# Patient Record
Sex: Female | Born: 1940 | Race: White | Marital: Married
Health system: Midwestern US, Community
[De-identification: ages and names within clinical notes are randomized; demographics above are authoritative.]

## PROBLEM LIST (undated history)

## (undated) DIAGNOSIS — J449 Chronic obstructive pulmonary disease, unspecified: Secondary | ICD-10-CM

## (undated) DIAGNOSIS — G4730 Sleep apnea, unspecified: Secondary | ICD-10-CM

## (undated) DIAGNOSIS — K219 Gastro-esophageal reflux disease without esophagitis: Secondary | ICD-10-CM

## (undated) DIAGNOSIS — I10 Essential (primary) hypertension: Secondary | ICD-10-CM

## (undated) DIAGNOSIS — I498 Other specified cardiac arrhythmias: Secondary | ICD-10-CM

## (undated) DIAGNOSIS — K5730 Diverticulosis of large intestine without perforation or abscess without bleeding: Secondary | ICD-10-CM

## (undated) DIAGNOSIS — E039 Hypothyroidism, unspecified: Secondary | ICD-10-CM

## (undated) HISTORY — DX: Chronic obstructive pulmonary disease, unspecified: J44.9

## (undated) HISTORY — PX: VAGINAL HYSTERECTOMY: SUR661

## (undated) HISTORY — DX: Other specified cardiac arrhythmias: I49.8

## (undated) HISTORY — PX: STOMACH SURGERY: SHX791

## (undated) HISTORY — DX: Sleep apnea, unspecified: G47.30

## (undated) HISTORY — DX: Hypothyroidism, unspecified: E03.9

## (undated) HISTORY — DX: Essential (primary) hypertension: I10

## (undated) HISTORY — PX: OTHER SURGICAL HISTORY: SHX169

## (undated) HISTORY — DX: Gastro-esophageal reflux disease without esophagitis: K21.9

## (undated) HISTORY — DX: Diverticulosis of large intestine without perforation or abscess without bleeding: K57.30

---

## 1998-06-15 HISTORY — PX: PACEMAKER INSERTION: SHX728

## 1999-04-14 ENCOUNTER — Ambulatory Visit (HOSPITAL_COMMUNITY): Admission: RE | Admit: 1999-04-14 | Discharge: 1999-04-15 | Payer: Self-pay | Admitting: Cardiology

## 1999-04-15 ENCOUNTER — Encounter: Payer: Self-pay | Admitting: Cardiology

## 2000-10-12 ENCOUNTER — Encounter: Payer: Self-pay | Admitting: Emergency Medicine

## 2000-10-12 ENCOUNTER — Emergency Department (HOSPITAL_COMMUNITY): Admission: EM | Admit: 2000-10-12 | Discharge: 2000-10-12 | Payer: Self-pay | Admitting: Emergency Medicine

## 2002-06-06 ENCOUNTER — Other Ambulatory Visit: Admission: RE | Admit: 2002-06-06 | Discharge: 2002-06-06 | Payer: Self-pay | Admitting: Internal Medicine

## 2002-07-03 ENCOUNTER — Encounter: Admission: RE | Admit: 2002-07-03 | Discharge: 2002-07-03 | Payer: Self-pay | Admitting: Endocrinology

## 2002-07-03 ENCOUNTER — Encounter: Payer: Self-pay | Admitting: Endocrinology

## 2002-08-20 ENCOUNTER — Encounter: Payer: Self-pay | Admitting: *Deleted

## 2002-08-20 ENCOUNTER — Inpatient Hospital Stay (HOSPITAL_COMMUNITY): Admission: EM | Admit: 2002-08-20 | Discharge: 2002-08-22 | Payer: Self-pay | Admitting: *Deleted

## 2003-05-31 ENCOUNTER — Encounter (INDEPENDENT_AMBULATORY_CARE_PROVIDER_SITE_OTHER): Payer: Self-pay | Admitting: Specialist

## 2003-05-31 ENCOUNTER — Ambulatory Visit (HOSPITAL_COMMUNITY): Admission: RE | Admit: 2003-05-31 | Discharge: 2003-05-31 | Payer: Self-pay | Admitting: *Deleted

## 2003-07-06 ENCOUNTER — Inpatient Hospital Stay (HOSPITAL_COMMUNITY): Admission: EM | Admit: 2003-07-06 | Discharge: 2003-07-09 | Payer: Self-pay | Admitting: Emergency Medicine

## 2005-02-13 ENCOUNTER — Encounter: Admission: RE | Admit: 2005-02-13 | Discharge: 2005-02-13 | Payer: Self-pay | Admitting: Endocrinology

## 2005-05-05 ENCOUNTER — Encounter: Admission: RE | Admit: 2005-05-05 | Discharge: 2005-05-05 | Payer: Self-pay | Admitting: Endocrinology

## 2009-01-21 ENCOUNTER — Ambulatory Visit (HOSPITAL_COMMUNITY): Admission: RE | Admit: 2009-01-21 | Discharge: 2009-01-21 | Payer: Self-pay | Admitting: Cardiology

## 2009-02-19 ENCOUNTER — Encounter (INDEPENDENT_AMBULATORY_CARE_PROVIDER_SITE_OTHER): Payer: Self-pay | Admitting: Cardiology

## 2009-02-19 ENCOUNTER — Other Ambulatory Visit: Admission: RE | Admit: 2009-02-19 | Discharge: 2009-02-19 | Payer: Self-pay | Admitting: Otolaryngology

## 2009-06-06 ENCOUNTER — Telehealth (INDEPENDENT_AMBULATORY_CARE_PROVIDER_SITE_OTHER): Payer: Self-pay | Admitting: *Deleted

## 2009-06-10 ENCOUNTER — Encounter (HOSPITAL_COMMUNITY): Admission: RE | Admit: 2009-06-10 | Discharge: 2009-08-19 | Payer: Self-pay | Admitting: Cardiology

## 2009-06-10 ENCOUNTER — Ambulatory Visit: Payer: Self-pay | Admitting: Cardiology

## 2009-06-10 ENCOUNTER — Ambulatory Visit: Payer: Self-pay

## 2009-07-31 ENCOUNTER — Inpatient Hospital Stay (HOSPITAL_COMMUNITY): Admission: RE | Admit: 2009-07-31 | Discharge: 2009-08-02 | Payer: Self-pay | Admitting: Cardiology

## 2009-07-31 ENCOUNTER — Ambulatory Visit: Payer: Self-pay | Admitting: Internal Medicine

## 2009-08-02 ENCOUNTER — Encounter: Payer: Self-pay | Admitting: Cardiology

## 2009-08-05 ENCOUNTER — Telehealth: Payer: Self-pay | Admitting: Internal Medicine

## 2009-08-08 ENCOUNTER — Encounter: Payer: Self-pay | Admitting: Internal Medicine

## 2009-08-08 ENCOUNTER — Ambulatory Visit: Payer: Self-pay

## 2009-09-11 ENCOUNTER — Ambulatory Visit (HOSPITAL_COMMUNITY): Admission: RE | Admit: 2009-09-11 | Discharge: 2009-09-11 | Payer: Self-pay | Admitting: Cardiology

## 2009-09-11 ENCOUNTER — Encounter (INDEPENDENT_AMBULATORY_CARE_PROVIDER_SITE_OTHER): Payer: Self-pay | Admitting: Cardiology

## 2009-09-11 ENCOUNTER — Ambulatory Visit: Payer: Self-pay | Admitting: Internal Medicine

## 2009-09-11 ENCOUNTER — Ambulatory Visit: Payer: Self-pay

## 2009-10-28 DIAGNOSIS — T82190A Other mechanical complication of cardiac electrode, initial encounter: Secondary | ICD-10-CM | POA: Insufficient documentation

## 2009-10-28 DIAGNOSIS — Z950 Presence of cardiac pacemaker: Secondary | ICD-10-CM | POA: Insufficient documentation

## 2009-10-28 DIAGNOSIS — E039 Hypothyroidism, unspecified: Secondary | ICD-10-CM | POA: Insufficient documentation

## 2009-10-28 DIAGNOSIS — E7800 Pure hypercholesterolemia, unspecified: Secondary | ICD-10-CM | POA: Insufficient documentation

## 2009-10-28 DIAGNOSIS — K219 Gastro-esophageal reflux disease without esophagitis: Secondary | ICD-10-CM | POA: Insufficient documentation

## 2009-10-28 DIAGNOSIS — G4733 Obstructive sleep apnea (adult) (pediatric): Secondary | ICD-10-CM | POA: Insufficient documentation

## 2009-10-28 DIAGNOSIS — I10 Essential (primary) hypertension: Secondary | ICD-10-CM | POA: Insufficient documentation

## 2009-10-28 DIAGNOSIS — K5730 Diverticulosis of large intestine without perforation or abscess without bleeding: Secondary | ICD-10-CM | POA: Insufficient documentation

## 2009-10-28 DIAGNOSIS — J449 Chronic obstructive pulmonary disease, unspecified: Secondary | ICD-10-CM | POA: Insufficient documentation

## 2009-10-29 ENCOUNTER — Ambulatory Visit: Payer: Self-pay | Admitting: Internal Medicine

## 2010-07-10 ENCOUNTER — Encounter (INDEPENDENT_AMBULATORY_CARE_PROVIDER_SITE_OTHER): Payer: Self-pay | Admitting: *Deleted

## 2010-07-12 ENCOUNTER — Encounter: Payer: Self-pay | Admitting: Internal Medicine

## 2010-07-15 NOTE — Progress Notes (Signed)
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  Will be glad to give note for work after appoinment.  Pt aware. Dennis Bast, RN, BSN  August 05, 2009 2:22 PM

## 2010-07-15 NOTE — Cardiovascular Report (Signed)
Summary: Office Visit   Office Visit   Imported By: Roderic Ovens 08/12/2009 13:13:05  _____________________________________________________________________  External Attachment:    Type:   Image     Comment:   External Document

## 2010-07-15 NOTE — Procedures (Signed)
Summary: wch/per dc summary/jss   Current Medications (verified): 1)  Paxil 40 Mg Tabs (Paroxetine Hcl) .... One By Mouth Two Times A Day 2)  Synthroid 150 Mcg Tabs (Levothyroxine Sodium) .... One By Mouth Daily 3)  Bystolic 5 Mg Tabs (Nebivolol Hcl) .... One By Mouth Daily 4)  Spiriva Handihaler 18 Mcg Caps (Tiotropium Bromide Monohydrate) .... Once Daily 5)  Oscal 500/200 D-3 500-200 Mg-Unit Tabs (Calcium-Vitamin D) .... One By Mouth Daily  Allergies (verified): 1)  Codeine  PPM Specifications Following MD:  Lewayne Bunting, MD     PPM Vendor:  [employer] Jude     PPM Model Number:  2110     PPM Serial Number:  [id number] PPM DOI:  01/21/2009     PPM Implanting MD:  NOT IIMPLANTED BY [place]  Lead 1    Location: RA     DOI: 07/31/2009     Model #: [id number]     Serial #: [id number]     Status: active Lead 2    Location: RV     DOI: 04/14/1999     Model #: 1336T     Serial #: [id number]     Status: active  Magnet Response Rate:  BOL 98.6 ERI 86.3  Indications:  Huston Foley with pauses   PPM Follow Up Remote Check?  No Battery Voltage:  2.92 V     Battery Est. Longevity:  6.6 years     Pacer Dependent:  Yes       PPM Device Measurements Atrium  Amplitude: 5 mV, Impedance: 490 ohms, Threshold: 0.5 V at 0.5 msec Right Ventricle  Amplitude: 10.5 mV, Impedance: 590 ohms, Threshold: 0.75 V at 0.5 msec  Episodes MS Episodes:  0     Percent Mode Switch:  0     Coumadin:  No Atrial Pacing:  98%     Ventricular Pacing:  <1%  Parameters Mode:  DDD     Lower Rate Limit:  65     Upper Rate Limit:  120 Paced AV Delay:  200     Sensed AV Delay:  200 Next Cardiology Appt Due:  10/13/2009 Tech Comments:  Auto capture programmed on in the ventricle.  Steri strips removed, no redness or edema.  ROV 3 months Dr. Ladona Ridgel. Altha Harm, LPN  August 08, 2009 12:16 PM

## 2010-07-15 NOTE — Assessment & Plan Note (Signed)
Summary: pc2   Visit Type:  Follow-up Primary Provider:  Matthew Folks   History of Present Illness: Ms. Hatcher returns today for followup.  She is a pleasant 70 yo woman with a h/o symptomatic bradycardia and HTN.  She had a PPM lead failure for which she underwent attempted repair followed by  insertion of a new lead.  I was involved in her care when she requred removal of her broken lead after it disintegrated in the pocket after tension had been applied to it resulting in removal of the insulation.  This was accomplished by extraction of the residual lead, leaving a small embedded tip in the myocardium.  She denies c/p or sob.  Problems Prior to Update: 1)  Pacemaker, Permanent  (ICD-V45.01) 2)  Pacemaker Malfunction  (ICD-996.01) 3)  Hypertension, Unspecified  (ICD-401.9) 4)  Hypercholesterolemia  (ICD-272.0) 5)  Gastroesophageal Reflux Disease  (ICD-530.81) 6)  COPD  (ICD-496) 7)  Hypothyroidism  (ICD-244.9) 8)  Sleep Apnea  (ICD-780.57) 9)  Diverticulosis, Colon  (ICD-562.10)  Current Medications (verified): 1)  Paxil 40 Mg Tabs (Paroxetine Hcl) .... One By Mouth Two Times A Day 2)  Synthroid 150 Mcg Tabs (Levothyroxine Sodium) .... One By Mouth Daily 3)  Bystolic 5 Mg Tabs (Nebivolol Hcl) .... One By Mouth Daily 4)  Spiriva Handihaler 18 Mcg Caps (Tiotropium Bromide Monohydrate) .... Once Daily 5)  Oscal 500/200 D-3 500-200 Mg-Unit Tabs (Calcium-Vitamin D) .... One By Mouth Daily 6)  Lisinopril 10 Mg Tabs (Lisinopril) .... Take One Tablet By Mouth Two Times A Day 7)  Simvastatin 40 Mg Tabs (Simvastatin) .... Take One Tablet By Mouth Daily At Bedtime 8)  Fish Oil   Oil (Fish Oil) .... Bid  Allergies: 1)  Codeine  Past History:  Review of Systems  The patient denies chest pain, syncope, dyspnea on exertion, and peripheral edema.    Vital Signs:  Patient profile:   70 year old female Height:      67.5 inches Weight:      229 pounds BMI:     35.46 Pulse rate:   65 /  minute BP sitting:   120 / 90  (left arm)  Vitals Entered By: Laurance Flatten CMA (Oct 29, 2009 11:59 AM)  Physical Exam  General:  Well developed, well nourished, in no acute distress.  HEENT: normal Neck: supple. No JVD. Carotids 2+ bilaterally no bruits Cor: RRR no rubs, gallops or murmur Lungs: CTA except for scattered rales. Ab: soft, nontender. nondistended. No HSM. Good bowel sounds Ext: warm. no cyanosis, clubbing or edema Neuro: alert and oriented. Grossly nonfocal. affect pleasant    PPM Specifications Following MD:  Lewayne Bunting, MD     PPM Vendor:  [employer] Jude     PPM Model Number:  2110     PPM Serial Number:  [id number] PPM DOI:  01/21/2009     PPM Implanting MD:  NOT IIMPLANTED BY [place]  Lead 1    Location: RA     DOI: 07/31/2009     Model #: [id number]     Serial #: [id number]     Status: active Lead 2    Location: RV     DOI: 04/14/1999     Model #: 1336T     Serial #: [id number]     Status: active  Magnet Response Rate:  BOL 98.6 ERI 86.3  Indications:  Huston Foley with pauses   PPM Follow Up Remote Check?  No Battery Voltage:  2.98 V  Battery Est. Longevity:  10.2 years     Pacer Dependent:  Yes       PPM Device Measurements Atrium  Amplitude: 4.7 mV, Impedance: 560 ohms, Threshold: 0.625 V at 0.5 msec Right Ventricle  Amplitude: 12 mV, Impedance: 590 ohms, Threshold: 0.75 V at 0.5 msec  Episodes MS Episodes:  1     Percent Mode Switch:  <1%     Coumadin:  No  Parameters Mode:  DDDR     Lower Rate Limit:  65     Upper Rate Limit:  120 Paced AV Delay:  200     Sensed AV Delay:  200 Next Cardiology Appt Due:  04/15/2010 Tech Comments:  Rate response activated.  ROV 6 months clinic. Altha Harm, LPN  Oct 29, 2009 12:11 PM  MD Comments:  Agree with above.  Impression & Recommendations:  Problem # 1:  PACEMAKER, PERMANENT (ICD-V45.01) Her current device is working normally.  I will recheck in several months.  Problem # 2:  HYPERTENSION, UNSPECIFIED (ICD-401.9) A low  sodium diet is recommended and I have asked her to continue her current meds. Her updated medication list for this problem includes:    Bystolic 5 Mg Tabs (Nebivolol hcl) ..... One by mouth daily    Lisinopril 10 Mg Tabs (Lisinopril) .Marland Kitchen... Take one tablet by mouth two times a day  Problem # 3:  HYPERCHOLESTEROLEMIA (ICD-272.0) A low fat diet is warranted.  She will continue her current meds. Her updated medication list for this problem includes:    Simvastatin 40 Mg Tabs (Simvastatin) .Marland Kitchen... Take one tablet by mouth daily at bedtime  Patient Instructions: 1)  Your physician recommends that you schedule a follow-up appointment in: 6 months with Dr Ladona Ridgel

## 2010-07-17 NOTE — Letter (Signed)
Summary: Device-Delinquent Check  [facility], Main Office  [address]   [place], [place] [address]   Phone: [phone]  Fax: [phone]     July 10, 2010 MRN: [id number]   [facility] [address] [place], [place]  [address]   Dear Ms. Brougher,  According to our records, you have not had your implanted device checked in the recommended period of time.  We are unable to determine appropriate device function without checking your device on a regular basis.  Please call our office to schedule an appointment, with Dr Ladona Ridgel,  as soon as possible.  If you are having your device checked by another physician, please call us so that we may update our records.  Thank you,  Letta Moynahan, EMT  July 10, 2010 1:39 PM  [facility] certified

## 2010-08-06 NOTE — Cardiovascular Report (Signed)
Summary: Certified Letter Signed - Patient (not doing f/u)  Certified Letter Signed - Patient (not doing f/u)   Imported By: Debby Freiberg 07/28/2010 11:45:00  _____________________________________________________________________  External Attachment:    Type:   Image     Comment:   External Document

## 2010-09-03 LAB — BASIC METABOLIC PANEL
BUN: 9 mg/dL (ref 6–23)
CO2: 28 mEq/L (ref 19–32)
Calcium: 8.8 mg/dL (ref 8.4–10.5)
Chloride: 106 mEq/L (ref 96–112)
GFR calc Af Amer: >60 mL/min (ref >60)
GFR calc non Af Amer: >60 mL/min (ref >60)
Glucose, Bld: 93 mg/dL (ref 70–99)
Sodium: 142 mEq/L (ref 135–145)

## 2010-09-03 LAB — CBC
HCT: 40.7 % (ref 36.0–46.0)
Hemoglobin: 13.9 g/dL (ref 12.0–15.0)
MCHC: 34.1 g/dL (ref 30.0–36.0)
Platelets: 191 10*3/uL (ref 150–400)

## 2010-09-03 LAB — CROSSMATCH: ABO/RH(D): O POS

## 2010-09-03 LAB — ABO/RH: ABO/RH(D): O POS

## 2010-09-03 LAB — APTT: aPTT: 29 seconds (ref 24–37)

## 2010-09-20 LAB — PROTIME-INR: INR: 1 (ref 0.00–1.49)

## 2010-09-20 LAB — CBC
HCT: 42.3 % (ref 36.0–46.0)
Hemoglobin: 14.5 g/dL (ref 12.0–15.0)
Platelets: 185 10*3/uL (ref 150–400)
RBC: 4.44 MIL/uL (ref 3.87–5.11)
RDW: 13.4 % (ref 11.5–15.5)

## 2010-09-20 LAB — BASIC METABOLIC PANEL
GFR calc Af Amer: >60 mL/min (ref >60)
Glucose, Bld: 99 mg/dL (ref 70–99)
Potassium: 4.3 mEq/L (ref 3.5–5.1)
Sodium: 140 mEq/L (ref 135–145)

## 2010-09-20 LAB — T3: T3, Total: 62.5 ng/dl — ABNORMAL LOW (ref 80.0–204.0)

## 2010-09-20 LAB — T4: T4, Total: 6.3 ug/dL (ref 5.0–12.5)

## 2010-09-20 LAB — APTT: aPTT: 29 seconds (ref 24–37)

## 2010-10-27 ENCOUNTER — Encounter: Payer: Self-pay | Admitting: Internal Medicine

## 2010-10-28 NOTE — Cardiovascular Report (Signed)
[name], Sheila Murray            ACCOUNT NO.:  [id number]   MEDICAL RECORD NO.:  [id number]          PATIENT TYPE:  OIB   LOCATION:  2899                         FACILITY:  [facility]   PHYSICIAN:  Antionette Char, MD    DATE OF BIRTH:  December 07, 1940   DATE OF PROCEDURE:  01/21/2009  DATE OF DISCHARGE:  01/21/2009                            CARDIAC CATHETERIZATION   PROCEDURE:  Replacement of pacemaker pulse generator.   INDICATIONS FOR PROCEDURE:  End-of-life characteristics on the previous  pulse generator battery.   PROCEDURE:  After signing an informed consent, the patient was  premedicated with 5 mg of Valium by mouth and brought to the Cardiac  Catheterization Lab at [facility].  Her right anterior chest  and base of neck were prepped and draped in sterile fashion and a right  transverse subclavicular plane was anesthetized locally using 1%  lidocaine.  An incision was made in this anesthetized plane with the  incision being deepened into the fibrous layer overlying the existing  pulse generator.  The fibrous layer was then incised exposing the pulse  generator, which was removed from the pocket.  The pulse generator was  then removed from the pacing electrodes in usual fashion.  Both leads  were then analyzing finding excellent long-term pacing thresholds in the  atrial lead.  The P-wave sensitivity measured 4.1 millivolts.  The  impedance measured 330 ohms and the minimum voltage threshold was 2.5  volts.  In the ventricular lead, the R wave sensitivity measured 8.8  millivolts.  The impedance measured 546 ohms and the minimum voltage  threshold was measured at 0.7 volts using 1.2 milliamps of current.  After obtaining these pacing parameters, the wound was lavaged profusely  with a kanamycin solution.  We then selected a new pulse generator made  by [facility] [employer], model number [id number], serial number  [id number].  After properly analyzing the new pulse generator,  it was  attached to the pacing electrodes in the usual fashion.  Pulse generator  was then placed within the existing pocket and the wound was closed in  layers using 2-0 Vicryl.  Final skin closure was obtained with a  cutaneous layer of Steri-Strips.  The patient tolerated the procedure  well and no complications were noted.  At the end of the procedure, a  sterile bulky dressing was applied to the wound and she was returned to  the short-stay unit for further monitoring and one further dose of  intravenous Ancef.  Wound care instructions were given.      Antionette Char, MD  Electronically Signed     JRT/MEDQ  D:  01/21/2009  T:  01/22/2009  Job:  [id number]   cc:   Cath Lab

## 2010-10-31 ENCOUNTER — Encounter: Payer: Self-pay | Admitting: Internal Medicine

## 2010-10-31 ENCOUNTER — Ambulatory Visit (INDEPENDENT_AMBULATORY_CARE_PROVIDER_SITE_OTHER): Payer: Medicare Other | Admitting: Internal Medicine

## 2010-10-31 DIAGNOSIS — I498 Other specified cardiac arrhythmias: Secondary | ICD-10-CM

## 2010-10-31 DIAGNOSIS — Z950 Presence of cardiac pacemaker: Secondary | ICD-10-CM

## 2010-10-31 DIAGNOSIS — I10 Essential (primary) hypertension: Secondary | ICD-10-CM

## 2010-10-31 DIAGNOSIS — R001 Bradycardia, unspecified: Secondary | ICD-10-CM

## 2010-10-31 NOTE — Op Note (Signed)
NAME:  Sheila Murray, Sheila Murray                      ACCOUNT NO.:  [id number]   MEDICAL RECORD NO.:  [id number]                   PATIENT TYPE:  AMB   LOCATION:  ENDO                                 FACILITY:   PHYSICIAN:  Georgiana Spinner, M.D.                 DATE OF BIRTH:  11-04-1940   DATE OF PROCEDURE:  05/31/2003  DATE OF DISCHARGE:                                 OPERATIVE REPORT   PROCEDURE:  Upper endoscopy.   INDICATIONS:  Hemoccult positivity, gastroesophageal reflux disease.   ANESTHESIA:  Demerol 60, Versed 6 mg   PROCEDURE:  With the patient mildly sedated in the left lateral decubitus  position, the Olympus videoscopic endoscope was inserted into the mouth,  passed under direct vision through the esophagus which appeared normal.  We  entered into the stomach and to the left we found a smaller lumen, to the  right a larger lumen which was the main gastric lumen.  We advanced through  this area first and we saw fundus body, antrum, and eventually duodenal  bulb, second portion of duodenum; all of which appeared normal and  photographs were taken.  The endoscope was then withdrawn taken  circumferential views of the duodenal mucosa until the endoscope had pulled  back into the stomach at which point we entered the left lumen and  photographed it previously.  It had also two lumen, one of which we could  get through, the other which was curved too far to the left for [place] to get  the endoscope into without causing some bleeding and therefore decided to  forego doing any further exam of that area.  The endoscope was then  withdrawn taking circumferential views of the remaining gastric and  esophageal mucosa.  The patient's vital signs, pulse oximetry remained  stable.  The patient tolerated procedure well without apparent  complications.   FINDINGS:  Failed gastric bypass surgery, but normal mucosa seen.   PLAN:  Will have patient followup with me as needed.  See colonoscopy  note  for further details.                                               Georgiana Spinner, M.D.    GMO/MEDQ  D:  05/31/2003  T:  05/31/2003  Job:  [id number]

## 2010-10-31 NOTE — Op Note (Signed)
NAME:  Sheila Murray, Sheila Murray                      ACCOUNT NO.:  [id number]   MEDICAL RECORD NO.:  [id number]                   PATIENT TYPE:  AMB   LOCATION:  ENDO                                 FACILITY:  [facility]   PHYSICIAN:  Georgiana Spinner, M.D.                 DATE OF BIRTH:  1940/11/25   DATE OF PROCEDURE:  05/31/2003  DATE OF DISCHARGE:                                 OPERATIVE REPORT   PROCEDURE:  Colonoscopy with polypectomy.   INDICATIONS:  Polyp, rectal bleeding.   ANESTHESIA:  Versed 2 mg.   PROCEDURE:  With the patient mildly sedated in the left lateral decubitus  position, the Olympus videoscopic colonoscope was inserted in the rectum,  passed through a tortuous sigmoid colon.  There was diverticula filled to  the cecum identified by ileocecal valve and appendiceal orifice, both of  which were photographed.  From this point, the colonoscope was slowly  withdrawn taking circumferential views of the colonic mucosa stopping only  at 20 cm from the anal verge at which point a polyp was seen, photographed  and removed using snare cautery technique.  Setting of 20/200 with ERBE  pulse generator.  The endoscope was then withdrawn to the rectum which  appeared normal and showed hemorrhoids only on retroflex view.  The  endoscope was straightened and withdrawn.  The patient's vital signs, pulse  oximetry remained stable.  The patient tolerated the procedure well without  apparent complication.   FINDINGS:  We had a good look at the rectum and hemorrhoids were seen on  retroflex.  There was a polyp at 20 cm.  There was diffuse diverticulosis,  much more so significant on the left than on the right.   PLAN:  Await biopsy report.  The patient will call for results and followup  with me as an outpatient.                                               Georgiana Spinner, M.D.    GMO/MEDQ  D:  05/31/2003  T:  05/31/2003  Job:  [id number]

## 2010-10-31 NOTE — Discharge Summary (Signed)
NAME:  Sheila Murray, Sheila Murray                      ACCOUNT NO.:  [id number]   MEDICAL RECORD NO.:  [id number]                   PATIENT TYPE:  INP   LOCATION:  3705                                 FACILITY:  [facility]   PHYSICIAN:  Jaclyn Prime. Lucas Mallow, M.D.                DATE OF BIRTH:  24-Jun-1940   DATE OF ADMISSION:  07/06/2003  DATE OF DISCHARGE:  07/09/2003                                 DISCHARGE SUMMARY   CHIEF COMPLAINT:  Chest pain.   HISTORY OF PRESENT ILLNESS:  This 70 year old woman, patient of John R.  [name], M.D., was awakened with chest pain in her lower mid chest and  upper abdomen.  She said that the sensation was as though something was  caught there as well as the persistent sensation of someone pushing on her  chest.  Because of these symptoms, and the pain lasted all night long and  had still not resolved in the morning.  This was a new symptom for her  altogether.   PAST MEDICAL HISTORY:  Heavy smoking up to the day prior to admission and  bradycardia status post pacemaker insertion, is outlined in the history and  physical, as are her family history, social history, and a comprehensive  review of systems.   PHYSICAL EXAMINATION:  VITAL SIGNS:  221 pounds in weight, blood pressure  140/84, pulse 60, and respirations 18.  GENERAL:  She is a well-developed, well-nourished, woman moderately to  markedly overweight.  Her general physical examination was unremarkable.  ABDOMEN:  Obese, but not distended.  SKIN:  Normal.  HEART:  Apical end pulse was cryptic and the heart sounds were distant.   LABORATORY DATA:  White count 8400, hemoglobin 14.1, hematocrit 41, mean  cell volume 92, platelets 202,000.  INR was 0.9.  Creatinine 0.9, glucose  101, electrolytes normal.  All of the other data in the comprehensive  metabolic panel normal except for an alkaline phosphatase slightly elevated  at 129.  Hemoglobin A1C was 5.1, upper limit of normal 6.1.  CPK; peak total  101,  peak MB 1.4, troponin peak 0.04.  LDL cholesterol 97, HDL 25,  triglycerides 188.  PSH 1.4, a normal level.  The patient had a pacemaker,  and thus her ST segments could not be evaluated.  In addition, she continued  to have chest discomfort.  Because of this combination of problems, it was  felt that the most expeditious course of action was to proceed with cardiac  catheterization and determine whether significant blockage was present.  Accordingly, on January 24, the patient had cardiac catheterization by Dr.  Jacinto Halim.  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   Dr. Jacinto Halim felt that no further intervention was required.  After the patient  was stable post catheterization, she was discharge without incident.   She was to resume her usual home medications, which consist of Paxil  40 mg  daily, Synthroid 0.15 mg daily, Protonix 40 mg daily, enteric-coated aspirin  81 mg daily, Lipitor 40 mg h.s., Os-Cal with vitamin D 600 mg twice a day,  Spiriva one puff everyday.  She is to complete a current course of Keflex  which she has been taking for swollen lymph node.   FINAL DIAGNOSES:  1. Chest pain, prolonged, myocardial infarction ruled out.  2. Chronic tobacco abuse.  3. Hypothyroidism.  4. Gastroesophageal reflux disease.  5. Tobacco-related pulmonary disease.  6. Calcium deficiency.  7. Dyslipidemia.   DIET:  Cholesterol lowering diet.   DISCHARGE MEDICATIONS:  As listed above.   ACTIVITY:  Light activity with no heavy lifting for three to four days.  Pain management not applicable.  Wound care not applicable. She may shower.   DISCHARGE INSTRUCTIONS:  She is to follow up with Dr. Aleen Campi and her  primary physician as needed.   PROCEDURE:  Cardiac catheterization.   COMPLICATIONS:  None.   Return to work to be determined at follow-up visit.                                                Jaclyn Prime. Lucas Mallow, M.D.    DDG/MEDQ  D:   08/15/2003  T:  08/15/2003  Job:  [id number]   cc:   Cristy Hilts. Jacinto Halim, M.D.  [address]  [place]  [place] [address]  Fax: [phone]   Aram Candela. Aleen Campi, M.D.  [address]  [place]  [place] [address]  Fax: [phone]

## 2010-10-31 NOTE — H&P (Signed)
NAME:  Sheila Murray, Sheila Murray                      ACCOUNT NO.:  [id number]   MEDICAL RECORD NO.:  [id number]                   PATIENT TYPE:  INP   LOCATION:  3705                                 FACILITY:  [facility]   PHYSICIAN:  Jaclyn Prime. Lucas Mallow, M.D.                DATE OF BIRTH:  04-24-1941   DATE OF ADMISSION:  07/06/2003  DATE OF DISCHARGE:                                HISTORY & PHYSICAL   CHIEF COMPLAINT:  Chest pain.   HISTORY OF THE PRESENT ILLNESS:  The patient is a 70 year old white female  patient of Dr. Aram Candela. [name], who was awakened last night with chest pain  in her lower mid-chest/upper abdomen area.  It felt like something was  caught there with a sensation of someone pushing on her chest.  Also, she  had an ache that radiated to her left arm which ran from her hand to her  shoulder.  She did have diaphoresis, she did have nausea with vomiting and  she did have shortness of breath.  She also had some diarrhea.  This pain  lasted all night long and she waited till this morning to call and ask her  daughter to help her decide what to do.  She said there was also heartburn  associated with it.  She does not have nitroglycerin because she has never  been diagnosed with coronary artery disease.  She says that even now there  is a small amount of pressure in her chest and that she has experienced  dizziness when she has stood up during the day.  She has never had these  symptoms before.  She will be admitted to the hospital, rule out myocardial  infarction; she is admitted to Dr. Jaclyn Prime. [place].   PAST MEDICAL HISTORY:  The patient's past medical history is significant  for:  1. 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  2. Hypertension.  3. A history of smoking -- she just quit yesterday.  4. Hypercholesterolemia.  5. Vasovagal symptoms.  6. Diverticulosis.  7. Hypothyroidism.  8. Cardiac arrhythmia.   PAST SURGICAL HISTORY:  Past surgical history is  significant for gastric  surgery, a hysterectomy and pacemaker insertion.   DRUG ALLERGIES:  The patient does not tolerate CODEINE, which causes nausea.   MEDICATIONS:  The patient takes:  1. Paxil 40 mg 1 a day.  2. Synthroid 0.150 mg 1 every day.  3. Protonix 40 mg 1 every day.  4. Enteric-coated aspirin 81 mg -- she is supposed to take that every day --     she takes that on occasion.  5. Lipitor 40 mg at bedtime every day.  6. Os-Cal with vitamin D 600 mg 1 twice a day.  7. Spiriva 1 puff every day.  8. Keflex 500 mg 1 twice a day for swollen lymph node.   FAMILY HISTORY:  The patient's mother had angina pectoris and  diabetes  mellitus.  The patient's father passed away from a GI bleed and he had a  history of prostate, rectal or colon cancer.   SOCIAL HISTORY:  The patient is married.  She is a Naval architect.  She has  just stopped smoking.  She does not use alcohol but she does drink coffee.   REVIEW OF SYSTEMS:  CONSTITUTIONAL:  The patient denies fevers or chills,  though she has had sweating with her chest pain.  Her weight is about the  same as usual.  She does not have swelling.  She normally sleeps very well.  EYES:  The patient wears glasses and has no other eye symptoms.  EARS, NOSE,  MOUTH AND THROAT:  The patient denies deafness, runny nose, dysgeusia and  she has a partial lower and a full upper plate of dentures.  CARDIOVASCULAR:  The patient had chest pain, as noted above.  She occasionally has a racing  feeling in her heart.  She has had shortness of breath with the chest pain  and perhaps a little more when she lies down.  She denies claudication.  RESPIRATORY:  The patient denies coughing or wheezing.  She has just quit  snoring.  GI:  The patient has had nausea and vomiting in the last 24 hours  as well as indigestion and diarrhea.  GENITOURINARY:  The patient denies  dysuria, pyuria, hematuria, anuria, hesitation or frequency.  She has  nocturia x1 and she  is post hysterectomy.  MUSCULOSKELETAL:  The patient  denies painful joints.  She does have some muscular cramps.  She has had  some fatigue lately.  Her gait is steady.  SKIN:  The patient denies rashes  or other skin problems.  BREASTS:  The patient denies masses, lumps,  tenderness or discharge of the breasts.  NEUROLOGIC:  The patient felt faint  and dizzy this morning upon standing and had an aching pain with some  numbness in her left arm.  PSYCHIATRIC:  The patient does have depression  and anxiety and is treated for this.  ENDOCRINE:  The patient has thyroid  disease but she denies diabetes.  HEMATOLOGIC:  The patient both bruises and  bleeds easily.  LYMPHATIC:  The patient does have one nodule in her left  throat behind the parietal saliva gland about pea size, mobile and nontender  for which she is being treated with Keflex.  ALLERGIC:  The patient does not  tolerate codeine, which causes nausea.  ALL OTHER SYSTEMS:  All other  systems are negative.   PHYSICAL EXAMINATION:  VITAL SIGNS:  The patient is 70 years old.  Her  weight is 221 pounds.  Her blood pressure is 140/84, her pulse is 60, her  respirations are 18, she is afebrile.  GENERAL:  The patient is a well-developed, well-nourished white female in no  acute distress.  PSYCHIATRIC:  The patient is pleasant, cooperative and responds  appropriately but is anxious.  HEENT:  The patient is normocephalic, atraumatic.  Her pupils are equal,  round and reactive to light.  Her mouth is moist.  Her oropharynx is benign.  CHEST:  The patient is eupneic.  Her chest is clear to auscultation and  percussion.  BREASTS:  The patient's breasts are of normal contour for her age and  weight.  There is no discharge or tenderness.  ADENOPATHY:  The patient does have cervical adenopathy on the left with one  identifiable nodule.  ABDOMEN:  The patient has  positive bowel sounds.  She is obese but not  distended.  Her abdomen is soft and  nontender. GENITOURINARY:  The patient is post hysterectomy and the patient is  nontender over her bladder.  EXTREMITIES:  The patient moves all extremities x4.  Her strength is 5/5 in  her upper and lower extremities.  She is without ankle edema.  SKIN:  The patient's skin is warm and dry without jaundice, cyanosis, pallor  or rashes.  She has brisk capillary refill.  NEUROLOGIC:  The patient is conscious, alert and oriented to person, place,  time and situation.  Cranial nerves II-XII are grossly intact and she has no  tremors.  PSYCHOSOCIAL:  The patient is accompanied to the office by her daughter, who  will also be going to the hospital.   LABORATORIES AND CHEST X-RAY:  These are pending.   IMPRESSION:  1. Chest pain:  Unstable angina.  2. Pacemaker.  3. Bradycardia.  4. Hypertension.  5. Hypercholesterolemia.  6. Hypothyroidism.  7. Diverticulosis.  8. History of cardiac arrhythmia.  9. History of depression.   PLAN:  1. Admit to [facility] telemetry, rule out MI.  2. Admission labs and cardiac enzymes with chest x-ray; EKG done in the     office.  3. O2 by nasal cannula.  4. Old chart to floor.  5. Home medications.  6. Nitroglycerin titrated to relieve chest pain and keep blood pressure     above 95 mmHg systolic.      Arletha Pili. Penni Bombard, N.P.                   Jaclyn Prime. Lucas Mallow, M.D.    LMK/MEDQ  D:  07/06/2003  T:  07/07/2003  Job:  [id number]

## 2010-10-31 NOTE — Cardiovascular Report (Signed)
NAME:  WINNONA, WARGO                      ACCOUNT NO.:  [id number]   MEDICAL RECORD NO.:  [id number]                   PATIENT TYPE:  INP   LOCATION:  3705                                 FACILITY:  [facility]   PHYSICIAN:  Cristy Hilts. Jacinto Halim, M.D.                  DATE OF BIRTH:  14-Aug-1940   DATE OF PROCEDURE:  07/09/2003  DATE OF DISCHARGE:                              CARDIAC CATHETERIZATION   ATTENDING CARDIOLOGIST:  Jaclyn Prime. Lucas Mallow, M.D.   PROCEDURES PERFORMED:  1. Left ventriculography.  2. Selective left and right coronary angiography.  3. Abdominal aortogram.  4. Selective right and left arteriography.  5. Right femoral angiography and closure of right femoral artery access with     Angio-Seal.   INDICATIONS:  Ms. Sheila Murray is a 70 year old Caucasian female with  history of smoking who also has a history of sick sinus syndrome, history of  hypertension, hyperlipidemia, depression, who was admitted to the hospital  with chest pain and history of unstable angina.  During this, she was  brought to the cardiac catheterization lab to evaluate her coronary anatomy.   HEMODYNAMIC DATA:  1. The left ventricular pressure was 144/19 with an end-diastolic pressure     of 25 mmHg.  2. The aortic pressure was 147/81 with a mean of 110 mmHg.   These parameters were obtained with patient in __________ rhythm.  WITH  PACING, THE AORTIC PRESSURES DECREASED BY 40 MMHG.   ANGIOGRAPHIC DATA:  1. Left ventricle.  GOOD PACING.  There was inferior and inferoapical     hypokinesis.  Ejection fraction was estimated about 40-45%.  IN NATIVE RHYTHM.  There was normal wall motion with ejection fraction  estimated around 50-55%.  There was no significant mitral regurgitation.  1. Right coronary artery.  The right coronary artery is a large caliber     vessel.  It is a dominant vessel.  It is normal.  2. Left main coronary artery.  The left main coronary artery is a large     caliber vessel.   It is normal.  3. Circumflex coronary artery.  The circumflex is a large caliber vessel.     It gives origin to a large obtuse marginal 1.  It is normal.  4. Left anterior descending artery.  The left anterior descending artery is     a large caliber vessel.  It gives origin to a moderate to large diagonal     1 and diagonal 2.  They are normal.  The LAD is normal also.   ABDOMINAL AORTOGRAM:  The abdominal aortogram revealed the presence of  radial artery stenosis.  There is mild tortuosity of the abdominal aorta.  No evidence of abdominal aortic aneurysm.   SELECTIVE RADIAL AORTOGRAPHY:  Selective radial aortography revealed  proximal 70-80% stenosis of radial artery.   IMPRESSION:  1. Normal left ventricular systolic function, ejection fraction 50-55% with  no wall motion abnormality when the patient was in native rhythm.  2. With paced rhythm, there was inferior inferoapical hypokinesis with     ejection fraction around 45%.  3. There was a decrease by about 40 mmHg in the central aortic pressures     with pacing.  4. Normal coronary arteries.  Right dominant circulation.  5. Right internal artery stenosis by about 70-80%.   RECOMMENDATIONS:  1. Evaluation for noncardiac causes of chest pain is indicated.  2. Pacemaker reprogramming may be considered.  Prolonging the AV delay may     be considered.  3. The patient can be discharged home today if possible.  She may need     evaluation for noncardiac causes of chest pain.  4. No risk factor modification is indicated.  Smoking cessation is     indicated.  5. 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  The patient will     follow up with Dr. Donavan Burnet for followup and evaluation of the same.   TECHNIQUE OF PROCEDURE:  Under usual sterile precautions, using a 6-French  right femoral arterial access a 6-French Multipurpose B2 catheter was  advanced from the ascending aorta over 0.035 mm J wire.  The catheter was   gently advanced to the left ventricle and left ventricular pressures were  monitored.  Hand contrast injection in the left ventricle was performed,  both in the LAO and RAO projections.  The catheter was flushed and pulled  back into the ascending aorta.  Pressure gradient across the aortic valve  was monitored.  Right coronary artery was selectively engaged and  angiography was performed.  In a similar fashion, the left main coronary  artery was selectively engaged and angiography was performed.  Then the  catheter was readvanced back into the left ventricle and when patient had  maintained native sinus rhythm, left ventriculography was performed in the  RAO projection.  Then the catheter was pulled back into the abdominal aorta  and abdominal aortogram was performed.  Because of the presence of renal  artery stenosis, selective radial arteriography was performed.  Then the  catheter was pulled out of the body in the usual fashion.  Right femoral  angiography was performed through the arterial access sheath and the access  with closed with Angio-Seal with excellent hemostasis obtained.  The patient  was transferred to the recovery area in a stable condition.  The patient  tolerated the procedure well.                                               Cristy Hilts. Jacinto Halim, M.D.    Pilar Plate  D:  07/09/2003  T:  07/09/2003  Job:  [id number]   cc:   Jaclyn Prime. Lucas Mallow, M.D.  [address]  [place]  [place] [address]  Fax: [phone]

## 2010-10-31 NOTE — Patient Instructions (Signed)
Your physician wants you to follow-up in: 6 months in the device clinic and 12 months with Dr Taylor You will receive a reminder letter in the mail two months in advance. If you don't receive a letter, please call our office to schedule the follow-up appointment.  

## 2010-11-01 ENCOUNTER — Encounter: Payer: Self-pay | Admitting: Internal Medicine

## 2010-11-01 NOTE — Assessment & Plan Note (Signed)
Her device is working normally. Will recheck in several months. 

## 2010-11-01 NOTE — Progress Notes (Signed)
HPI Sheila Murray returns today for followup. She is a pleasant 70 yo man with a h/o symptomatic bradycardia, HTN, dyslipidemia. She is s/p PPM. She denies c/p, sob, or peripheral edema. No syncope. Allergies  Allergen Reactions  . Codeine     REACTION: Nausea     Current Outpatient Prescriptions  Medication Sig Dispense Refill  . calcium-vitamin D (OSCAL WITH D) 500-200 MG-UNIT per tablet Take 1 tablet by mouth daily.        . fish oil-omega-3 fatty acids 1000 MG capsule Take 2 g by mouth daily.        Marland Kitchen levothyroxine (SYNTHROID, LEVOTHROID) 150 MCG tablet Take 150 mcg by mouth daily.        Marland Kitchen lisinopril (PRINIVIL,ZESTRIL) 10 MG tablet Take 10 mg by mouth 2 (two) times daily.        . nebivolol (BYSTOLIC) 5 MG tablet Take 5 mg by mouth every other day.       Marland Kitchen PARoxetine (PAXIL) 40 MG tablet Take 40 mg by mouth daily.       . simvastatin (ZOCOR) 40 MG tablet Take 40 mg by mouth at bedtime.        Marland Kitchen tiotropium (SPIRIVA) 18 MCG inhalation capsule Place 18 mcg into inhaler and inhale daily.           Past Medical History  Diagnosis Date  . HTN (hypertension)   . GERD (gastroesophageal reflux disease)   . COPD (chronic obstructive pulmonary disease)   . Hypothyroidism   . Sleep apnea   . Diverticulosis of colon     ROS:   All systems reviewed and negative except as noted in the HPI.   Past Surgical History  Procedure Date  . Pacemaker insertion 2000    St Jude  . Stomach surgery   . Vaginal hysterectomy   . Eyelid surgery      Family History  Problem Relation Age of Onset  . Angina    . Diabetes    . Cancer    . GI problems       History   Social History  . Marital Status: Married    Spouse Name: N/A    Number of Children: N/A  . Years of Education: N/A   Occupational History  . Not on file.   Social History Main Topics  . Smoking status: Current Some Day Smoker    Types: Cigarettes    Last Attempt to Quit: 06/15/2000  . Smokeless tobacco: Never Used   . Alcohol Use: No  . Drug Use: Not on file  . Sexually Active: Not on file   Other Topics Concern  . Not on file   Social History Narrative  . No narrative on file     BP 132/92  Pulse 68  Ht 5\' 7"  (1.702 m)  Wt 233 lb 6.4 oz (105.87 kg)  BMI 36.56 kg/m2  Physical Exam:  Well appearing NAD HEENT: Unremarkable Neck:  No JVD, no thyromegally Lymphatics:  No adenopathy Back:  No CVA tenderness Lungs:  Clear. Well healed PPM incision. HEART:  Regular rate rhythm, no murmurs, no rubs, no clicks Abd:  Flat, positive bowel sounds, no organomegally, no rebound, no guarding Ext:  2 plus pulses, no edema, no cyanosis, no clubbing Skin:  No rashes no nodules Neuro:  CN II through XII intact, motor grossly intact DEVICE  Normal device function.  See PaceArt for details.   Assess/Plan:

## 2010-11-01 NOTE — Assessment & Plan Note (Signed)
Her blood pressure is well controlled. She will continue a low sodium diet and her current meds.

## 2011-03-19 ENCOUNTER — Telehealth: Payer: Self-pay | Admitting: Internal Medicine

## 2011-03-19 NOTE — Telephone Encounter (Signed)
Returned call to patient she is c/o her heart is speeding up and then drops back down  Her BP has been running 70/50 HR 65  137/93

## 2011-03-19 NOTE — Telephone Encounter (Signed)
Pt does not feel well.  Having irregular heart rate for last 4 days, BP is up and down and she would like to be seen.  Melissa holding spot for tomorrow.

## 2011-03-27 NOTE — Telephone Encounter (Signed)
lmom for patient to call me back  I have discussed her BP and HR issue with Dr Ladona Ridgel and have his recommendations  He suggested that we cut the Lisinopril to 1/2 tablet twice daily for her BP and if needed we can make further adjustments for her HR

## 2011-07-01 DIAGNOSIS — R0602 Shortness of breath: Secondary | ICD-10-CM | POA: Diagnosis not present

## 2011-07-24 DIAGNOSIS — E0789 Other specified disorders of thyroid: Secondary | ICD-10-CM | POA: Diagnosis not present

## 2011-07-24 DIAGNOSIS — E789 Disorder of lipoprotein metabolism, unspecified: Secondary | ICD-10-CM | POA: Diagnosis not present

## 2011-07-24 DIAGNOSIS — J019 Acute sinusitis, unspecified: Secondary | ICD-10-CM | POA: Diagnosis not present

## 2011-07-24 DIAGNOSIS — R0602 Shortness of breath: Secondary | ICD-10-CM | POA: Diagnosis not present

## 2011-08-03 DIAGNOSIS — I2789 Other specified pulmonary heart diseases: Secondary | ICD-10-CM | POA: Diagnosis not present

## 2011-08-03 DIAGNOSIS — E789 Disorder of lipoprotein metabolism, unspecified: Secondary | ICD-10-CM | POA: Diagnosis not present

## 2011-08-03 DIAGNOSIS — R9431 Abnormal electrocardiogram [ECG] [EKG]: Secondary | ICD-10-CM | POA: Diagnosis not present

## 2011-08-03 DIAGNOSIS — R0609 Other forms of dyspnea: Secondary | ICD-10-CM | POA: Diagnosis not present

## 2011-08-03 DIAGNOSIS — R079 Chest pain, unspecified: Secondary | ICD-10-CM | POA: Diagnosis not present

## 2011-08-03 DIAGNOSIS — I2510 Atherosclerotic heart disease of native coronary artery without angina pectoris: Secondary | ICD-10-CM | POA: Diagnosis not present

## 2011-08-03 DIAGNOSIS — R0989 Other specified symptoms and signs involving the circulatory and respiratory systems: Secondary | ICD-10-CM | POA: Diagnosis not present

## 2011-08-03 DIAGNOSIS — E7800 Pure hypercholesterolemia, unspecified: Secondary | ICD-10-CM | POA: Diagnosis not present

## 2011-08-17 DIAGNOSIS — R079 Chest pain, unspecified: Secondary | ICD-10-CM | POA: Diagnosis not present

## 2011-08-17 DIAGNOSIS — R9431 Abnormal electrocardiogram [ECG] [EKG]: Secondary | ICD-10-CM | POA: Diagnosis not present

## 2011-08-17 DIAGNOSIS — I70219 Atherosclerosis of native arteries of extremities with intermittent claudication, unspecified extremity: Secondary | ICD-10-CM | POA: Diagnosis not present

## 2011-08-17 DIAGNOSIS — R0602 Shortness of breath: Secondary | ICD-10-CM | POA: Diagnosis not present

## 2011-08-21 DIAGNOSIS — R079 Chest pain, unspecified: Secondary | ICD-10-CM | POA: Diagnosis not present

## 2011-08-21 DIAGNOSIS — R9431 Abnormal electrocardiogram [ECG] [EKG]: Secondary | ICD-10-CM | POA: Diagnosis not present

## 2011-08-21 DIAGNOSIS — R0602 Shortness of breath: Secondary | ICD-10-CM | POA: Diagnosis not present

## 2011-08-28 DIAGNOSIS — Z950 Presence of cardiac pacemaker: Secondary | ICD-10-CM | POA: Diagnosis not present

## 2011-08-28 DIAGNOSIS — I2789 Other specified pulmonary heart diseases: Secondary | ICD-10-CM | POA: Diagnosis not present

## 2011-08-28 DIAGNOSIS — R0609 Other forms of dyspnea: Secondary | ICD-10-CM | POA: Diagnosis not present

## 2011-08-28 DIAGNOSIS — R9431 Abnormal electrocardiogram [ECG] [EKG]: Secondary | ICD-10-CM | POA: Diagnosis not present

## 2011-09-17 DIAGNOSIS — E0789 Other specified disorders of thyroid: Secondary | ICD-10-CM | POA: Diagnosis not present

## 2011-09-17 DIAGNOSIS — E789 Disorder of lipoprotein metabolism, unspecified: Secondary | ICD-10-CM | POA: Diagnosis not present

## 2011-09-17 DIAGNOSIS — R0609 Other forms of dyspnea: Secondary | ICD-10-CM | POA: Diagnosis not present

## 2011-09-17 DIAGNOSIS — R0989 Other specified symptoms and signs involving the circulatory and respiratory systems: Secondary | ICD-10-CM | POA: Diagnosis not present

## 2011-10-15 DIAGNOSIS — E789 Disorder of lipoprotein metabolism, unspecified: Secondary | ICD-10-CM | POA: Diagnosis not present

## 2011-10-15 DIAGNOSIS — E0789 Other specified disorders of thyroid: Secondary | ICD-10-CM | POA: Diagnosis not present

## 2011-10-15 DIAGNOSIS — I10 Essential (primary) hypertension: Secondary | ICD-10-CM | POA: Diagnosis not present

## 2011-10-21 DIAGNOSIS — E0789 Other specified disorders of thyroid: Secondary | ICD-10-CM | POA: Diagnosis not present

## 2011-10-21 DIAGNOSIS — I10 Essential (primary) hypertension: Secondary | ICD-10-CM | POA: Diagnosis not present

## 2011-10-21 DIAGNOSIS — R0989 Other specified symptoms and signs involving the circulatory and respiratory systems: Secondary | ICD-10-CM | POA: Diagnosis not present

## 2011-10-21 DIAGNOSIS — R0609 Other forms of dyspnea: Secondary | ICD-10-CM | POA: Diagnosis not present

## 2011-11-03 ENCOUNTER — Ambulatory Visit (INDEPENDENT_AMBULATORY_CARE_PROVIDER_SITE_OTHER): Payer: Medicare Other | Admitting: Internal Medicine

## 2011-11-03 ENCOUNTER — Encounter: Payer: Self-pay | Admitting: Internal Medicine

## 2011-11-03 VITALS — BP 122/84 | HR 72 | Ht 67.5 in | Wt 237.8 lb

## 2011-11-03 DIAGNOSIS — I498 Other specified cardiac arrhythmias: Secondary | ICD-10-CM | POA: Diagnosis not present

## 2011-11-03 DIAGNOSIS — Z950 Presence of cardiac pacemaker: Secondary | ICD-10-CM

## 2011-11-03 DIAGNOSIS — I10 Essential (primary) hypertension: Secondary | ICD-10-CM

## 2011-11-03 LAB — PACEMAKER DEVICE OBSERVATION
AL AMPLITUDE: 4.7 mv
ATRIAL PACING PM: 86
BATTERY VOLTAGE: 2.9478 V
DEVICE MODEL PM: 2296554

## 2011-11-03 NOTE — Assessment & Plan Note (Signed)
Her blood pressure is well controlled. She will continue to attempt to lose weight, maintain a low-sodium diet, increase her physical activity, and try to reduce her by mouth intake.

## 2011-11-03 NOTE — Assessment & Plan Note (Signed)
Her device is working normally. We'll plan to recheck in several months. She has several mode switching episodes noted on the device interrogation which are artifact.

## 2011-11-03 NOTE — Patient Instructions (Signed)
Your physician wants you to follow-up in: 6 months in the device clinic and 12 months with Dr Taylor You will receive a reminder letter in the mail two months in advance. If you don't receive a letter, please call our office to schedule the follow-up appointment.  

## 2011-11-03 NOTE — Progress Notes (Signed)
HPI Mrs. Samet returns today for followup. She is a very pleasant 71 year old woman with morbid obesity, hypertension, symptomatic bradycardia, status post permanent pacemaker insertion. Several months ago, she developed worsening shortness of breath. Subsequent evaluation was for the most part unrevealing. The patient states that she was told that she was too fat. She essentially lost over 20 pounds and feels better. Her dyspnea is improved. She denies syncope or chest pain. Allergies  Allergen Reactions  . Codeine     REACTION: Nausea     Current Outpatient Prescriptions  Medication Sig Dispense Refill  . calcium-vitamin D (OSCAL WITH D) 500-200 MG-UNIT per tablet Take 1 tablet by mouth daily.        Marland Kitchen ezetimibe (ZETIA) 10 MG tablet Take 10 mg by mouth daily.      . fish oil-omega-3 fatty acids 1000 MG capsule Take 2 g by mouth daily.        Marland Kitchen levothyroxine (SYNTHROID, LEVOTHROID) 150 MCG tablet Take 150 mcg by mouth daily.        Marland Kitchen lisinopril (PRINIVIL,ZESTRIL) 10 MG tablet Take 10 mg by mouth daily.       Marland Kitchen omeprazole (PRILOSEC) 40 MG capsule Take 40 mg by mouth as needed.      Marland Kitchen PARoxetine (PAXIL) 40 MG tablet Take 20 mg by mouth 2 (two) times daily.       . phentermine (ADIPEX-P) 37.5 MG tablet Take 37.5 mg by mouth daily before breakfast.      . simvastatin (ZOCOR) 40 MG tablet Take 40 mg by mouth at bedtime.        Marland Kitchen tiotropium (SPIRIVA) 18 MCG inhalation capsule Place 18 mcg into inhaler and inhale daily.           Past Medical History  Diagnosis Date  . HTN (hypertension)   . GERD (gastroesophageal reflux disease)   . COPD (chronic obstructive pulmonary disease)   . Hypothyroidism   . Sleep apnea   . Diverticulosis of colon   . Other specified cardiac dysrhythmias     ROS:   All systems reviewed and negative except as noted in the HPI.   Past Surgical History  Procedure Date  . Pacemaker insertion 2000    St Jude  . Stomach surgery   . Vaginal hysterectomy     . Eyelid surgery      Family History  Problem Relation Age of Onset  . Angina    . Diabetes    . Cancer    . GI problems       History   Social History  . Marital Status: Married    Spouse Name: N/A    Number of Children: N/A  . Years of Education: N/A   Occupational History  . Not on file.   Social History Main Topics  . Smoking status: Former Smoker    Types: Cigarettes    Quit date: 06/16/2011  . Smokeless tobacco: Never Used  . Alcohol Use: No  . Drug Use: Not on file  . Sexually Active: Not on file   Other Topics Concern  . Not on file   Social History Narrative  . No narrative on file     BP 122/84  Pulse 72  Ht 5' 7.5" (1.715 m)  Wt 237 lb 12.8 oz (107.865 kg)  BMI 36.69 kg/m2  Physical Exam:  Well appearing obese 71 year old woman, NAD HEENT: Unremarkable Neck:  No JVD, no thyromegally Lungs:  Clear with minimal basilar rales. No wheezes or  rhonchi are present. No increased work of breathing. HEART:  Regular rate rhythm, no murmurs, no rubs, no clicks heart sounds are distant. Abd:  soft, positive bowel sounds, no organomegally, no rebound, no guarding Ext:  2 plus pulses, no edema, no cyanosis, no clubbing Skin:  No rashes no nodules Neuro:  CN II through XII intact, motor grossly intact   DEVICE  Normal device function.  See PaceArt for details.   Assess/Plan:

## 2011-12-11 DIAGNOSIS — I10 Essential (primary) hypertension: Secondary | ICD-10-CM | POA: Diagnosis not present

## 2011-12-11 DIAGNOSIS — R0989 Other specified symptoms and signs involving the circulatory and respiratory systems: Secondary | ICD-10-CM | POA: Diagnosis not present

## 2011-12-11 DIAGNOSIS — Z23 Encounter for immunization: Secondary | ICD-10-CM | POA: Diagnosis not present

## 2011-12-11 DIAGNOSIS — R0609 Other forms of dyspnea: Secondary | ICD-10-CM | POA: Diagnosis not present

## 2011-12-11 DIAGNOSIS — M79609 Pain in unspecified limb: Secondary | ICD-10-CM | POA: Diagnosis not present

## 2012-01-29 DIAGNOSIS — R0609 Other forms of dyspnea: Secondary | ICD-10-CM | POA: Diagnosis not present

## 2012-01-29 DIAGNOSIS — E789 Disorder of lipoprotein metabolism, unspecified: Secondary | ICD-10-CM | POA: Diagnosis not present

## 2012-01-29 DIAGNOSIS — I10 Essential (primary) hypertension: Secondary | ICD-10-CM | POA: Diagnosis not present

## 2012-01-29 DIAGNOSIS — R0989 Other specified symptoms and signs involving the circulatory and respiratory systems: Secondary | ICD-10-CM | POA: Diagnosis not present

## 2012-03-10 DIAGNOSIS — E669 Obesity, unspecified: Secondary | ICD-10-CM | POA: Diagnosis not present

## 2012-03-10 DIAGNOSIS — E039 Hypothyroidism, unspecified: Secondary | ICD-10-CM | POA: Diagnosis not present

## 2012-03-10 DIAGNOSIS — I10 Essential (primary) hypertension: Secondary | ICD-10-CM | POA: Diagnosis not present

## 2012-04-14 DIAGNOSIS — E789 Disorder of lipoprotein metabolism, unspecified: Secondary | ICD-10-CM | POA: Diagnosis not present

## 2012-04-14 DIAGNOSIS — Z23 Encounter for immunization: Secondary | ICD-10-CM | POA: Diagnosis not present

## 2012-04-14 DIAGNOSIS — R0609 Other forms of dyspnea: Secondary | ICD-10-CM | POA: Diagnosis not present

## 2012-04-14 DIAGNOSIS — R0989 Other specified symptoms and signs involving the circulatory and respiratory systems: Secondary | ICD-10-CM | POA: Diagnosis not present

## 2012-04-14 DIAGNOSIS — E0789 Other specified disorders of thyroid: Secondary | ICD-10-CM | POA: Diagnosis not present

## 2012-06-16 DIAGNOSIS — R0609 Other forms of dyspnea: Secondary | ICD-10-CM | POA: Diagnosis not present

## 2012-06-16 DIAGNOSIS — I10 Essential (primary) hypertension: Secondary | ICD-10-CM | POA: Diagnosis not present

## 2012-06-16 DIAGNOSIS — K625 Hemorrhage of anus and rectum: Secondary | ICD-10-CM | POA: Diagnosis not present

## 2012-06-16 DIAGNOSIS — E789 Disorder of lipoprotein metabolism, unspecified: Secondary | ICD-10-CM | POA: Diagnosis not present

## 2012-06-16 DIAGNOSIS — R0989 Other specified symptoms and signs involving the circulatory and respiratory systems: Secondary | ICD-10-CM | POA: Diagnosis not present

## 2012-06-29 DIAGNOSIS — K625 Hemorrhage of anus and rectum: Secondary | ICD-10-CM | POA: Diagnosis not present

## 2012-06-29 DIAGNOSIS — Z1211 Encounter for screening for malignant neoplasm of colon: Secondary | ICD-10-CM | POA: Diagnosis not present

## 2012-07-07 DIAGNOSIS — K5730 Diverticulosis of large intestine without perforation or abscess without bleeding: Secondary | ICD-10-CM | POA: Diagnosis not present

## 2012-07-07 DIAGNOSIS — D126 Benign neoplasm of colon, unspecified: Secondary | ICD-10-CM | POA: Diagnosis not present

## 2012-07-07 DIAGNOSIS — K921 Melena: Secondary | ICD-10-CM | POA: Diagnosis not present

## 2012-07-07 DIAGNOSIS — K649 Unspecified hemorrhoids: Secondary | ICD-10-CM | POA: Diagnosis not present

## 2012-07-07 DIAGNOSIS — K625 Hemorrhage of anus and rectum: Secondary | ICD-10-CM | POA: Diagnosis not present

## 2012-07-21 ENCOUNTER — Encounter: Payer: Self-pay | Admitting: *Deleted

## 2012-09-30 DIAGNOSIS — H524 Presbyopia: Secondary | ICD-10-CM | POA: Diagnosis not present

## 2012-09-30 DIAGNOSIS — H2510 Age-related nuclear cataract, unspecified eye: Secondary | ICD-10-CM | POA: Diagnosis not present

## 2012-11-04 ENCOUNTER — Encounter: Payer: Medicare Other | Admitting: Internal Medicine

## 2012-11-15 DIAGNOSIS — H2510 Age-related nuclear cataract, unspecified eye: Secondary | ICD-10-CM | POA: Diagnosis not present

## 2012-11-15 DIAGNOSIS — H3530 Unspecified macular degeneration: Secondary | ICD-10-CM | POA: Diagnosis not present

## 2012-11-21 DIAGNOSIS — Z8249 Family history of ischemic heart disease and other diseases of the circulatory system: Secondary | ICD-10-CM | POA: Diagnosis not present

## 2012-11-21 DIAGNOSIS — Z79899 Other long term (current) drug therapy: Secondary | ICD-10-CM | POA: Diagnosis not present

## 2012-11-21 DIAGNOSIS — Z7982 Long term (current) use of aspirin: Secondary | ICD-10-CM | POA: Diagnosis not present

## 2012-11-21 DIAGNOSIS — G4730 Sleep apnea, unspecified: Secondary | ICD-10-CM | POA: Diagnosis not present

## 2012-11-21 DIAGNOSIS — K219 Gastro-esophageal reflux disease without esophagitis: Secondary | ICD-10-CM | POA: Diagnosis not present

## 2012-11-21 DIAGNOSIS — I10 Essential (primary) hypertension: Secondary | ICD-10-CM | POA: Diagnosis not present

## 2012-11-21 DIAGNOSIS — Z950 Presence of cardiac pacemaker: Secondary | ICD-10-CM | POA: Diagnosis not present

## 2012-11-21 DIAGNOSIS — E079 Disorder of thyroid, unspecified: Secondary | ICD-10-CM | POA: Diagnosis not present

## 2012-11-21 DIAGNOSIS — I498 Other specified cardiac arrhythmias: Secondary | ICD-10-CM | POA: Diagnosis not present

## 2012-11-21 DIAGNOSIS — Z87891 Personal history of nicotine dependence: Secondary | ICD-10-CM | POA: Diagnosis not present

## 2012-11-21 DIAGNOSIS — H2510 Age-related nuclear cataract, unspecified eye: Secondary | ICD-10-CM | POA: Diagnosis not present

## 2012-11-21 DIAGNOSIS — Z90710 Acquired absence of both cervix and uterus: Secondary | ICD-10-CM | POA: Diagnosis not present

## 2012-11-21 DIAGNOSIS — H3530 Unspecified macular degeneration: Secondary | ICD-10-CM | POA: Diagnosis not present

## 2012-11-21 DIAGNOSIS — H269 Unspecified cataract: Secondary | ICD-10-CM | POA: Diagnosis not present

## 2012-11-21 DIAGNOSIS — J449 Chronic obstructive pulmonary disease, unspecified: Secondary | ICD-10-CM | POA: Diagnosis not present

## 2012-11-24 DIAGNOSIS — E789 Disorder of lipoprotein metabolism, unspecified: Secondary | ICD-10-CM | POA: Diagnosis not present

## 2012-11-24 DIAGNOSIS — I10 Essential (primary) hypertension: Secondary | ICD-10-CM | POA: Diagnosis not present

## 2012-11-24 DIAGNOSIS — N390 Urinary tract infection, site not specified: Secondary | ICD-10-CM | POA: Diagnosis not present

## 2012-12-12 ENCOUNTER — Telehealth: Payer: Self-pay | Admitting: Internal Medicine

## 2012-12-12 NOTE — Telephone Encounter (Signed)
New Prob      Pt has some questions regarding some symptoms she is experiencing: dizziness, some pressure in chest. Would like to speak to nurse.

## 2012-12-13 NOTE — Telephone Encounter (Addendum)
Spoke with patient and she went to [facility] and had an EKG and labs that were all normal.  She wants her appointment moved up from August.  I have moved her appointment to Tues 12/20/12 at 11am

## 2012-12-14 DIAGNOSIS — H2510 Age-related nuclear cataract, unspecified eye: Secondary | ICD-10-CM | POA: Diagnosis not present

## 2012-12-19 DIAGNOSIS — J449 Chronic obstructive pulmonary disease, unspecified: Secondary | ICD-10-CM | POA: Diagnosis not present

## 2012-12-19 DIAGNOSIS — Z7982 Long term (current) use of aspirin: Secondary | ICD-10-CM | POA: Diagnosis not present

## 2012-12-19 DIAGNOSIS — Z9989 Dependence on other enabling machines and devices: Secondary | ICD-10-CM | POA: Diagnosis not present

## 2012-12-19 DIAGNOSIS — E079 Disorder of thyroid, unspecified: Secondary | ICD-10-CM | POA: Diagnosis not present

## 2012-12-19 DIAGNOSIS — Z950 Presence of cardiac pacemaker: Secondary | ICD-10-CM | POA: Diagnosis not present

## 2012-12-19 DIAGNOSIS — H2510 Age-related nuclear cataract, unspecified eye: Secondary | ICD-10-CM | POA: Diagnosis not present

## 2012-12-19 DIAGNOSIS — Z79899 Other long term (current) drug therapy: Secondary | ICD-10-CM | POA: Diagnosis not present

## 2012-12-19 DIAGNOSIS — I10 Essential (primary) hypertension: Secondary | ICD-10-CM | POA: Diagnosis not present

## 2012-12-19 DIAGNOSIS — Z87891 Personal history of nicotine dependence: Secondary | ICD-10-CM | POA: Diagnosis not present

## 2012-12-19 DIAGNOSIS — G4730 Sleep apnea, unspecified: Secondary | ICD-10-CM | POA: Diagnosis not present

## 2012-12-19 DIAGNOSIS — I498 Other specified cardiac arrhythmias: Secondary | ICD-10-CM | POA: Diagnosis not present

## 2012-12-19 DIAGNOSIS — F411 Generalized anxiety disorder: Secondary | ICD-10-CM | POA: Diagnosis not present

## 2012-12-19 DIAGNOSIS — H269 Unspecified cataract: Secondary | ICD-10-CM | POA: Diagnosis not present

## 2012-12-20 ENCOUNTER — Encounter: Payer: Self-pay | Admitting: Internal Medicine

## 2012-12-20 ENCOUNTER — Ambulatory Visit (INDEPENDENT_AMBULATORY_CARE_PROVIDER_SITE_OTHER): Payer: Medicare Other | Admitting: Internal Medicine

## 2012-12-20 VITALS — BP 126/78 | HR 65 | Ht 67.5 in | Wt 225.4 lb

## 2012-12-20 DIAGNOSIS — Z950 Presence of cardiac pacemaker: Secondary | ICD-10-CM | POA: Diagnosis not present

## 2012-12-20 DIAGNOSIS — I498 Other specified cardiac arrhythmias: Secondary | ICD-10-CM | POA: Diagnosis not present

## 2012-12-20 DIAGNOSIS — R0789 Other chest pain: Secondary | ICD-10-CM | POA: Diagnosis not present

## 2012-12-20 LAB — PACEMAKER DEVICE OBSERVATION
AL IMPEDENCE PM: 487.5 Ohm
BAMS-0001: 170 {beats}/min
BAMS-0003: 70 {beats}/min
BATTERY VOLTAGE: 2.9178 V
DEVICE MODEL PM: 2296554
RV LEAD AMPLITUDE: 11.4 mv

## 2012-12-20 NOTE — Assessment & Plan Note (Signed)
The patient has intermittent exercise-induced chest pressure. She has multiple cardiac risks. She is unable to walk and has a paced rhythm. She will undergo elective scan Myoview stress testing to further evaluate her exercise chest pressure.

## 2012-12-20 NOTE — Progress Notes (Signed)
HPI Sheila Murray returns today for followup. She is a very pleasant 72 year old woman with a history of symptomatic bradycardia, obesity, and hypertension. She has lost another 12 pounds. She has occasional episodes of shortness of breath. Recently, she had an episode of substernal chest pressure. She ruled out for MI in the [facility] emergency room. She's had additional episode or 2 with exertion. The patient is sedentary but has trouble walking. Allergies  Allergen Reactions  . Codeine     REACTION: Nausea     Current Outpatient Prescriptions  Medication Sig Dispense Refill  . calcium-vitamin D (OSCAL WITH D) 500-200 MG-UNIT per tablet Take 1 tablet by mouth daily.        . DUREZOL 0.05 % EMUL Apply 1 drop to eye daily.       . fish oil-omega-3 fatty acids 1000 MG capsule Take 2 g by mouth daily.        . ILEVRO 0.3 % SUSP Apply 1 drop to eye 2 (two) times daily.       Marland Kitchen levothyroxine (SYNTHROID, LEVOTHROID) 150 MCG tablet Take 150 mcg by mouth daily.        Marland Kitchen lisinopril (PRINIVIL,ZESTRIL) 10 MG tablet Take 10 mg by mouth daily.       Marland Kitchen omeprazole (PRILOSEC) 40 MG capsule Take 40 mg by mouth as needed.      Marland Kitchen PARoxetine (PAXIL) 40 MG tablet Take 20 mg by mouth 2 (two) times daily.       . simvastatin (ZOCOR) 40 MG tablet Take 40 mg by mouth at bedtime.        Marland Kitchen tiotropium (SPIRIVA) 18 MCG inhalation capsule Place 18 mcg into inhaler and inhale daily.        Marland Kitchen VIGAMOX 0.5 % ophthalmic solution Place 1 drop into the left eye 2 (two) times daily.        No current facility-administered medications for this visit.     Past Medical History  Diagnosis Date  . HTN (hypertension)   . GERD (gastroesophageal reflux disease)   . COPD (chronic obstructive pulmonary disease)   . Hypothyroidism   . Sleep apnea   . Diverticulosis of colon   . Other specified cardiac dysrhythmias(427.89)     ROS:   All systems reviewed and negative except as noted in the HPI.   Past Surgical History   Procedure Laterality Date  . Pacemaker insertion  2000    St Jude  . Stomach surgery    . Vaginal hysterectomy    . Eyelid surgery       Family History  Problem Relation Age of Onset  . Angina    . Diabetes    . Cancer    . GI problems       History   Social History  . Marital Status: Married    Spouse Name: N/A    Number of Children: N/A  . Years of Education: N/A   Occupational History  . Not on file.   Social History Main Topics  . Smoking status: Former Smoker    Types: Cigarettes    Quit date: 06/16/2011  . Smokeless tobacco: Never Used  . Alcohol Use: No  . Drug Use: Not on file  . Sexually Active: Not on file   Other Topics Concern  . Not on file   Social History Narrative  . No narrative on file     BP 126/78  Pulse 65  Ht 5' 7.5" (1.715 m)  Wt 225  lb 6.4 oz (102.241 kg)  BMI 34.76 kg/m2  Physical Exam:  Chronically ill appearing NAD HEENT: Unremarkable Neck:  No JVD, no thyromegally Lungs:  Clear with no wheezes, rales, or rhonchi. 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 Atrial pacing  DEVICE  Normal device function.  See PaceArt for details.   Assess/Plan:

## 2012-12-20 NOTE — Patient Instructions (Addendum)
Your physician has requested that you have a lexiscan myoview. For further information please visit [email or web address] Please follow instruction sheet, as given.  Your physician wants you to follow-up in: 6 months for device check and in 1 year with Dr Ladona Ridgel. You will receive a reminder letter in the mail two months in advance. If you don't receive a letter, please call our office to schedule the follow-up appointment.

## 2012-12-20 NOTE — Assessment & Plan Note (Signed)
Her St. Jude dual-chamber pacemaker is working normally. We'll plan to recheck in several months. 

## 2012-12-21 ENCOUNTER — Ambulatory Visit (HOSPITAL_COMMUNITY): Payer: Medicare Other | Attending: Cardiology | Admitting: Radiology

## 2012-12-21 VITALS — BP 140/90 | Ht 67.5 in | Wt 224.0 lb

## 2012-12-21 DIAGNOSIS — R002 Palpitations: Secondary | ICD-10-CM | POA: Insufficient documentation

## 2012-12-21 DIAGNOSIS — R0789 Other chest pain: Secondary | ICD-10-CM | POA: Diagnosis not present

## 2012-12-21 DIAGNOSIS — R0602 Shortness of breath: Secondary | ICD-10-CM | POA: Diagnosis not present

## 2012-12-21 DIAGNOSIS — Z950 Presence of cardiac pacemaker: Secondary | ICD-10-CM | POA: Insufficient documentation

## 2012-12-21 DIAGNOSIS — I10 Essential (primary) hypertension: Secondary | ICD-10-CM | POA: Insufficient documentation

## 2012-12-21 DIAGNOSIS — R079 Chest pain, unspecified: Secondary | ICD-10-CM | POA: Diagnosis not present

## 2012-12-21 DIAGNOSIS — Z87891 Personal history of nicotine dependence: Secondary | ICD-10-CM | POA: Diagnosis not present

## 2012-12-21 DIAGNOSIS — R110 Nausea: Secondary | ICD-10-CM

## 2012-12-21 MED ORDER — TECHNETIUM TC 99M SESTAMIBI GENERIC - CARDIOLITE
10.0000 | Freq: Once | INTRAVENOUS | Status: AC | PRN
  Administered 2012-12-21: 10 via INTRAVENOUS

## 2012-12-21 MED ORDER — REGADENOSON 0.4 MG/5ML IV SOLN
0.4000 mg | Freq: Once | INTRAVENOUS | Status: AC
  Administered 2012-12-21: 0.4 mg via INTRAVENOUS

## 2012-12-21 MED ORDER — TECHNETIUM TC 99M SESTAMIBI GENERIC - CARDIOLITE
30.0000 | Freq: Once | INTRAVENOUS | Status: AC | PRN
  Administered 2012-12-21: 30 via INTRAVENOUS

## 2012-12-21 MED ORDER — AMINOPHYLLINE 25 MG/ML IV SOLN
75.0000 mg | Freq: Once | INTRAVENOUS | Status: AC
  Administered 2012-12-21: 75 mg via INTRAVENOUS

## 2012-12-21 NOTE — Progress Notes (Signed)
[facility] S[facility] NUCLEAR MED [address] [place], [place] [address] [phone]    Cardiology Nuclear Med Study  Sheila Murray is a 72 y.o. female     MRN : [id number]     DOB: 02/24/1941  Procedure Date: 12/21/2012  Nuclear Med Background Indication for Stress Test:  Evaluation for Ischemia History:  2001 Pacemaker, '05 Heart Cath: EF:40-45% NL, 2010 MPS: (-) ischemia not gated  '11  ECHO: EF: 55% Cardiac Risk Factors: History of Smoking, Hypertension and Lipids  Symptoms:  Chest Pressure, Palpitations and SOB   Nuclear Pre-Procedure Caffeine/Decaff Intake:  None NPO After: 9:00pm   Lungs:  clear O2 Sat: 98% on room air. IV 0.9% NS with Angio Cath:  22g  IV Site: L Hand  IV Started by:  Bonnita Levan, RN  Chest Size (in):  38 Cup Size: B  Height: 5' 7.5" (1.715 m)  Weight:  224 lb (101.606 kg)  BMI:  Body mass index is 34.55 kg/(m^2). Tech Comments:  This patient was given 75 mg Aminophylline IV for all symptoms. Complete reversal for all the symptoms.    Nuclear Med Study 1 or 2 day study: 1 day  Stress Test Type:  Lexiscan  Reading MD: Cassell Clement, MD  Order Authorizing Provider:  Sharrell Ku, MD  Resting Radionuclide: Technetium 29m Sestamibi  Resting Radionuclide Dose: 11.0 mCi   Stress Radionuclide:  Technetium 72m Sestamibi  Stress Radionuclide Dose: 33.0 mCi           Stress Protocol Rest HR: 65 Stress HR: 70  Rest BP: 140/90 Stress BP: 133/82  Exercise Time (min): n/a METS: n/a   Predicted Max HR: 149 bpm % Max HR: 46.98 bpm Rate Pressure Product: 9800   Dose of Adenosine (mg):  n/a Dose of Lexiscan: 0.4 mg  Dose of Atropine (mg): n/a Dose of Dobutamine: n/a mcg/kg/min (at max HR)  Stress Test Technologist: Milana Na, EMT-P  Nuclear Technologist:  Doyne Keel, CNMT     Rest Procedure:  Myocardial perfusion imaging was performed at rest 45 minutes following the intravenous administration of Technetium 66m Sestamibi. Rest ECG:  NSR with non-specific ST-T wave changes  Stress Procedure:  The patient received IV Lexiscan 0.4 mg over 15-seconds.  Technetium 60m Sestamibi injected at 30-seconds.  This patient had sob and nausea with the Lexiscan injection. Quantitative spect images were obtained after a 45 minute delay. Stress ECG: No significant change from baseline ECG  QPS Raw Data Images:  Normal; no motion artifact; normal heart/lung ratio. Stress Images:  Normal homogeneous uptake in all areas of the myocardium. Rest Images:  Normal homogeneous uptake in all areas of the myocardium. Subtraction (SDS):  No evidence of ischemia. Transient Ischemic Dilatation (Normal <1.22):  n/a Lung/Heart Ratio (Normal <0.45):  0.42  Quantitative Gated Spect Images QGS EDV:  60 ml QGS ESV:  21 ml  Impression Exercise Capacity:  Lexiscan with no exercise. BP Response:  Normal blood pressure response. Clinical Symptoms:  No chest pain. ECG Impression:  No significant ST segment change suggestive of ischemia. Comparison with Prior Nuclear Study: No images to compare  Overall Impression:  Normal stress nuclear study.  LV Ejection Fraction: 65%.  LV Wall Motion:  Normal Wall Motion   Cassell Clement

## 2013-02-07 ENCOUNTER — Encounter: Payer: Medicare Other | Admitting: Internal Medicine

## 2013-02-23 DIAGNOSIS — E0789 Other specified disorders of thyroid: Secondary | ICD-10-CM | POA: Diagnosis not present

## 2013-02-23 DIAGNOSIS — R918 Other nonspecific abnormal finding of lung field: Secondary | ICD-10-CM | POA: Diagnosis not present

## 2013-02-23 DIAGNOSIS — M79609 Pain in unspecified limb: Secondary | ICD-10-CM | POA: Diagnosis not present

## 2013-02-23 DIAGNOSIS — E789 Disorder of lipoprotein metabolism, unspecified: Secondary | ICD-10-CM | POA: Diagnosis not present

## 2013-02-23 DIAGNOSIS — R0609 Other forms of dyspnea: Secondary | ICD-10-CM | POA: Diagnosis not present

## 2013-02-23 DIAGNOSIS — Z0000 Encounter for general adult medical examination without abnormal findings: Secondary | ICD-10-CM | POA: Diagnosis not present

## 2013-05-23 DIAGNOSIS — F17200 Nicotine dependence, unspecified, uncomplicated: Secondary | ICD-10-CM | POA: Diagnosis not present

## 2013-05-23 DIAGNOSIS — F3289 Other specified depressive episodes: Secondary | ICD-10-CM | POA: Diagnosis not present

## 2013-05-23 DIAGNOSIS — I10 Essential (primary) hypertension: Secondary | ICD-10-CM | POA: Diagnosis not present

## 2013-05-23 DIAGNOSIS — H60399 Other infective otitis externa, unspecified ear: Secondary | ICD-10-CM | POA: Diagnosis not present

## 2013-05-23 DIAGNOSIS — E7800 Pure hypercholesterolemia, unspecified: Secondary | ICD-10-CM | POA: Diagnosis not present

## 2013-05-23 DIAGNOSIS — J449 Chronic obstructive pulmonary disease, unspecified: Secondary | ICD-10-CM | POA: Diagnosis not present

## 2013-05-23 DIAGNOSIS — E079 Disorder of thyroid, unspecified: Secondary | ICD-10-CM | POA: Diagnosis not present

## 2013-05-23 DIAGNOSIS — Z950 Presence of cardiac pacemaker: Secondary | ICD-10-CM | POA: Diagnosis not present

## 2013-05-23 DIAGNOSIS — F329 Major depressive disorder, single episode, unspecified: Secondary | ICD-10-CM | POA: Diagnosis not present

## 2013-05-23 DIAGNOSIS — Z79899 Other long term (current) drug therapy: Secondary | ICD-10-CM | POA: Diagnosis not present

## 2013-06-06 DIAGNOSIS — G4733 Obstructive sleep apnea (adult) (pediatric): Secondary | ICD-10-CM | POA: Diagnosis not present

## 2013-06-27 ENCOUNTER — Encounter: Payer: Self-pay | Admitting: *Deleted

## 2013-07-27 ENCOUNTER — Ambulatory Visit (INDEPENDENT_AMBULATORY_CARE_PROVIDER_SITE_OTHER): Payer: Medicare Other | Admitting: *Deleted

## 2013-07-27 DIAGNOSIS — I498 Other specified cardiac arrhythmias: Secondary | ICD-10-CM | POA: Diagnosis not present

## 2013-07-27 LAB — MDC_IDC_ENUM_SESS_TYPE_INCLINIC
Battery Voltage: 2.92 V
Date Time Interrogation Session: 20150212122402
Implantable Pulse Generator Model: 2110
Implantable Pulse Generator Serial Number: 2296554
Lead Channel Impedance Value: 600 Ohm
Lead Channel Pacing Threshold Amplitude: 0.375 V
Lead Channel Pacing Threshold Amplitude: 0.875 V
Lead Channel Pacing Threshold Pulse Width: 0.5 ms
Lead Channel Pacing Threshold Pulse Width: 0.5 ms
Lead Channel Setting Pacing Amplitude: 1.125
Lead Channel Setting Sensing Sensitivity: 2 mV
MDC IDC MSMT BATTERY REMAINING LONGEVITY: 81.6 mo
MDC IDC MSMT LEADCHNL RA IMPEDANCE VALUE: 537.5 Ohm
MDC IDC MSMT LEADCHNL RA SENSING INTR AMPL: 3.2 mV
MDC IDC MSMT LEADCHNL RV SENSING INTR AMPL: 12 mV
MDC IDC SET LEADCHNL RA PACING AMPLITUDE: 1.375
MDC IDC SET LEADCHNL RV PACING PULSEWIDTH: 0.5 ms
MDC IDC STAT BRADY RA PERCENT PACED: 87 %
MDC IDC STAT BRADY RV PERCENT PACED: 2.9 %

## 2013-07-27 NOTE — Progress Notes (Signed)
Pacemaker check in clinic. Normal device function. Thresholds, sensing, impedances consistent with previous measurements. Device programmed to maximize longevity. No mode switch or high ventricular rates noted. Device programmed at appropriate safety margins. Histogram distribution appropriate for patient activity level. Device programmed to optimize intrinsic conduction. Estimated longevity 6.8 years. Patient education completed.  ROV 6 months with Dr. Lovena Le.

## 2013-08-15 ENCOUNTER — Encounter: Payer: Self-pay | Admitting: Internal Medicine

## 2013-08-15 DIAGNOSIS — H3530 Unspecified macular degeneration: Secondary | ICD-10-CM | POA: Diagnosis not present

## 2013-08-15 DIAGNOSIS — H04129 Dry eye syndrome of unspecified lacrimal gland: Secondary | ICD-10-CM | POA: Diagnosis not present

## 2013-08-15 DIAGNOSIS — Z961 Presence of intraocular lens: Secondary | ICD-10-CM | POA: Diagnosis not present

## 2013-09-12 DIAGNOSIS — G4710 Hypersomnia, unspecified: Secondary | ICD-10-CM | POA: Diagnosis not present

## 2013-09-12 DIAGNOSIS — G4733 Obstructive sleep apnea (adult) (pediatric): Secondary | ICD-10-CM | POA: Diagnosis not present

## 2013-09-12 DIAGNOSIS — I10 Essential (primary) hypertension: Secondary | ICD-10-CM | POA: Diagnosis not present

## 2013-09-12 DIAGNOSIS — R0609 Other forms of dyspnea: Secondary | ICD-10-CM | POA: Diagnosis not present

## 2013-09-26 DIAGNOSIS — E669 Obesity, unspecified: Secondary | ICD-10-CM | POA: Diagnosis not present

## 2013-09-26 DIAGNOSIS — G4733 Obstructive sleep apnea (adult) (pediatric): Secondary | ICD-10-CM | POA: Diagnosis not present

## 2013-09-26 DIAGNOSIS — R259 Unspecified abnormal involuntary movements: Secondary | ICD-10-CM | POA: Diagnosis not present

## 2013-09-26 DIAGNOSIS — E789 Disorder of lipoprotein metabolism, unspecified: Secondary | ICD-10-CM | POA: Diagnosis not present

## 2013-10-03 DIAGNOSIS — E789 Disorder of lipoprotein metabolism, unspecified: Secondary | ICD-10-CM | POA: Diagnosis not present

## 2013-10-03 DIAGNOSIS — I10 Essential (primary) hypertension: Secondary | ICD-10-CM | POA: Diagnosis not present

## 2013-12-16 DIAGNOSIS — S81009A Unspecified open wound, unspecified knee, initial encounter: Secondary | ICD-10-CM | POA: Diagnosis not present

## 2013-12-16 DIAGNOSIS — S8990XA Unspecified injury of unspecified lower leg, initial encounter: Secondary | ICD-10-CM | POA: Diagnosis not present

## 2013-12-16 DIAGNOSIS — S81809A Unspecified open wound, unspecified lower leg, initial encounter: Secondary | ICD-10-CM | POA: Diagnosis not present

## 2013-12-16 DIAGNOSIS — S99919A Unspecified injury of unspecified ankle, initial encounter: Secondary | ICD-10-CM | POA: Diagnosis not present

## 2013-12-16 DIAGNOSIS — Z87891 Personal history of nicotine dependence: Secondary | ICD-10-CM | POA: Diagnosis not present

## 2014-01-02 DIAGNOSIS — R0609 Other forms of dyspnea: Secondary | ICD-10-CM | POA: Diagnosis not present

## 2014-01-02 DIAGNOSIS — I10 Essential (primary) hypertension: Secondary | ICD-10-CM | POA: Diagnosis not present

## 2014-01-02 DIAGNOSIS — E789 Disorder of lipoprotein metabolism, unspecified: Secondary | ICD-10-CM | POA: Diagnosis not present

## 2014-02-13 DIAGNOSIS — E0789 Other specified disorders of thyroid: Secondary | ICD-10-CM | POA: Diagnosis not present

## 2014-02-13 DIAGNOSIS — R300 Dysuria: Secondary | ICD-10-CM | POA: Diagnosis not present

## 2014-02-13 DIAGNOSIS — R0609 Other forms of dyspnea: Secondary | ICD-10-CM | POA: Diagnosis not present

## 2014-02-13 DIAGNOSIS — R350 Frequency of micturition: Secondary | ICD-10-CM | POA: Diagnosis not present

## 2014-02-13 DIAGNOSIS — I10 Essential (primary) hypertension: Secondary | ICD-10-CM | POA: Diagnosis not present

## 2014-03-13 DIAGNOSIS — H3530 Unspecified macular degeneration: Secondary | ICD-10-CM | POA: Diagnosis not present

## 2014-03-13 DIAGNOSIS — H04129 Dry eye syndrome of unspecified lacrimal gland: Secondary | ICD-10-CM | POA: Diagnosis not present

## 2014-03-13 DIAGNOSIS — Z961 Presence of intraocular lens: Secondary | ICD-10-CM | POA: Diagnosis not present

## 2014-03-13 DIAGNOSIS — H26499 Other secondary cataract, unspecified eye: Secondary | ICD-10-CM | POA: Diagnosis not present

## 2014-04-09 DIAGNOSIS — F17210 Nicotine dependence, cigarettes, uncomplicated: Secondary | ICD-10-CM | POA: Diagnosis not present

## 2014-04-09 DIAGNOSIS — E039 Hypothyroidism, unspecified: Secondary | ICD-10-CM | POA: Diagnosis not present

## 2014-04-09 DIAGNOSIS — W19XXXA Unspecified fall, initial encounter: Secondary | ICD-10-CM | POA: Diagnosis not present

## 2014-04-09 DIAGNOSIS — S8012XA Contusion of left lower leg, initial encounter: Secondary | ICD-10-CM | POA: Diagnosis not present

## 2014-04-09 DIAGNOSIS — F419 Anxiety disorder, unspecified: Secondary | ICD-10-CM | POA: Diagnosis not present

## 2014-04-09 DIAGNOSIS — M79662 Pain in left lower leg: Secondary | ICD-10-CM | POA: Diagnosis not present

## 2014-04-09 DIAGNOSIS — I10 Essential (primary) hypertension: Secondary | ICD-10-CM | POA: Diagnosis not present

## 2014-04-09 DIAGNOSIS — Z79899 Other long term (current) drug therapy: Secondary | ICD-10-CM | POA: Diagnosis not present

## 2014-04-12 DIAGNOSIS — E780 Pure hypercholesterolemia: Secondary | ICD-10-CM | POA: Diagnosis not present

## 2014-04-12 DIAGNOSIS — S300XXA Contusion of lower back and pelvis, initial encounter: Secondary | ICD-10-CM | POA: Diagnosis not present

## 2014-04-12 DIAGNOSIS — F17210 Nicotine dependence, cigarettes, uncomplicated: Secondary | ICD-10-CM | POA: Diagnosis not present

## 2014-04-12 DIAGNOSIS — W1830XA Fall on same level, unspecified, initial encounter: Secondary | ICD-10-CM | POA: Diagnosis not present

## 2014-04-12 DIAGNOSIS — Z950 Presence of cardiac pacemaker: Secondary | ICD-10-CM | POA: Diagnosis not present

## 2014-04-12 DIAGNOSIS — F419 Anxiety disorder, unspecified: Secondary | ICD-10-CM | POA: Diagnosis not present

## 2014-04-12 DIAGNOSIS — Z792 Long term (current) use of antibiotics: Secondary | ICD-10-CM | POA: Diagnosis not present

## 2014-04-12 DIAGNOSIS — M25559 Pain in unspecified hip: Secondary | ICD-10-CM | POA: Diagnosis not present

## 2014-04-12 DIAGNOSIS — R110 Nausea: Secondary | ICD-10-CM | POA: Diagnosis not present

## 2014-04-12 DIAGNOSIS — S301XXA Contusion of abdominal wall, initial encounter: Secondary | ICD-10-CM | POA: Diagnosis not present

## 2014-04-12 DIAGNOSIS — Z79899 Other long term (current) drug therapy: Secondary | ICD-10-CM | POA: Diagnosis not present

## 2014-04-12 DIAGNOSIS — E039 Hypothyroidism, unspecified: Secondary | ICD-10-CM | POA: Diagnosis not present

## 2014-04-12 DIAGNOSIS — I10 Essential (primary) hypertension: Secondary | ICD-10-CM | POA: Diagnosis not present

## 2014-04-12 DIAGNOSIS — S7002XA Contusion of left hip, initial encounter: Secondary | ICD-10-CM | POA: Diagnosis not present

## 2014-04-17 ENCOUNTER — Encounter: Payer: Self-pay | Admitting: *Deleted

## 2014-05-15 DIAGNOSIS — I10 Essential (primary) hypertension: Secondary | ICD-10-CM | POA: Diagnosis not present

## 2014-05-15 DIAGNOSIS — E789 Disorder of lipoprotein metabolism, unspecified: Secondary | ICD-10-CM | POA: Diagnosis not present

## 2014-05-15 DIAGNOSIS — E032 Hypothyroidism due to medicaments and other exogenous substances: Secondary | ICD-10-CM | POA: Diagnosis not present

## 2014-05-15 DIAGNOSIS — Z23 Encounter for immunization: Secondary | ICD-10-CM | POA: Diagnosis not present

## 2014-05-15 DIAGNOSIS — R0689 Other abnormalities of breathing: Secondary | ICD-10-CM | POA: Diagnosis not present

## 2014-05-23 ENCOUNTER — Encounter: Payer: Self-pay | Admitting: *Deleted

## 2014-07-01 ENCOUNTER — Encounter: Admit: 2014-07-01

## 2014-07-01 ENCOUNTER — Inpatient Hospital Stay
Admit: 2014-07-01 | Discharge: 2014-07-01 | Disposition: A | Discharge status: Home / Self Care | Attending: Emergency Medicine

## 2014-07-01 DIAGNOSIS — T1490XA Injury, unspecified, initial encounter: Secondary | ICD-10-CM

## 2014-07-01 DIAGNOSIS — I10 Essential (primary) hypertension: Secondary | ICD-10-CM | POA: Diagnosis not present

## 2014-07-01 DIAGNOSIS — M79661 Pain in right lower leg: Secondary | ICD-10-CM | POA: Diagnosis not present

## 2014-07-01 DIAGNOSIS — S80811A Abrasion, right lower leg, initial encounter: Secondary | ICD-10-CM | POA: Diagnosis not present

## 2014-07-01 DIAGNOSIS — T148 Other injury of unspecified body region: Secondary | ICD-10-CM | POA: Diagnosis not present

## 2014-07-01 DIAGNOSIS — T1490 Injury, unspecified: Secondary | ICD-10-CM | POA: Diagnosis not present

## 2014-07-01 DIAGNOSIS — M47816 Spondylosis without myelopathy or radiculopathy, lumbar region: Secondary | ICD-10-CM | POA: Diagnosis not present

## 2014-07-01 DIAGNOSIS — R600 Localized edema: Secondary | ICD-10-CM | POA: Diagnosis not present

## 2014-07-01 DIAGNOSIS — S40011A Contusion of right shoulder, initial encounter: Secondary | ICD-10-CM | POA: Diagnosis not present

## 2014-07-01 DIAGNOSIS — S8011XA Contusion of right lower leg, initial encounter: Secondary | ICD-10-CM | POA: Diagnosis not present

## 2014-07-01 DIAGNOSIS — Z79899 Other long term (current) drug therapy: Secondary | ICD-10-CM | POA: Diagnosis not present

## 2014-07-01 DIAGNOSIS — M19011 Primary osteoarthritis, right shoulder: Secondary | ICD-10-CM | POA: Diagnosis not present

## 2014-07-01 DIAGNOSIS — S8991XA Unspecified injury of right lower leg, initial encounter: Secondary | ICD-10-CM | POA: Diagnosis not present

## 2014-07-01 DIAGNOSIS — S4991XA Unspecified injury of right shoulder and upper arm, initial encounter: Secondary | ICD-10-CM | POA: Diagnosis not present

## 2014-07-01 DIAGNOSIS — S9001XA Contusion of right ankle, initial encounter: Secondary | ICD-10-CM | POA: Diagnosis not present

## 2014-07-01 DIAGNOSIS — S8001XA Contusion of right knee, initial encounter: Secondary | ICD-10-CM | POA: Diagnosis not present

## 2014-07-01 DIAGNOSIS — Z950 Presence of cardiac pacemaker: Secondary | ICD-10-CM | POA: Diagnosis not present

## 2014-07-01 DIAGNOSIS — S7011XA Contusion of right thigh, initial encounter: Secondary | ICD-10-CM | POA: Diagnosis not present

## 2014-07-01 DIAGNOSIS — E780 Pure hypercholesterolemia: Secondary | ICD-10-CM | POA: Diagnosis not present

## 2014-07-01 DIAGNOSIS — S79921A Unspecified injury of right thigh, initial encounter: Secondary | ICD-10-CM | POA: Diagnosis not present

## 2014-07-01 DIAGNOSIS — F17210 Nicotine dependence, cigarettes, uncomplicated: Secondary | ICD-10-CM | POA: Diagnosis not present

## 2014-07-01 DIAGNOSIS — E039 Hypothyroidism, unspecified: Secondary | ICD-10-CM | POA: Diagnosis not present

## 2014-07-01 DIAGNOSIS — S3993XA Unspecified injury of pelvis, initial encounter: Secondary | ICD-10-CM | POA: Diagnosis not present

## 2014-07-01 MED ORDER — MORPHINE SULFATE (PF) 4 MG/ML IV SOLN
4 MG/ML | INTRAVENOUS | Status: DC
Start: 2014-07-01 — End: 2014-07-01

## 2014-07-01 MED ORDER — ONDANSETRON 4 MG PO TBDP
4 MG | ORAL | Status: DC
Start: 2014-07-01 — End: 2014-07-01

## 2014-07-01 MED ORDER — HYDROCODONE-ACETAMINOPHEN 5-325 MG PO TABS
5-325 MG | ORAL_TABLET | Freq: Three times a day (TID) | ORAL | Status: AC | PRN
Start: 2014-07-01 — End: 2014-07-08

## 2014-07-01 MED ORDER — KETOROLAC TROMETHAMINE 60 MG/2ML IM SOLN
60 MG/2ML | INTRAMUSCULAR | Status: DC
Start: 2014-07-01 — End: 2014-07-01

## 2014-07-01 MED ORDER — NAPROXEN 500 MG PO TABS
500 MG | ORAL_TABLET | Freq: Two times a day (BID) | ORAL | Status: AC
Start: 2014-07-01 — End: ?

## 2014-07-01 MED FILL — ONDANSETRON 4 MG PO TBDP: 4 MG | ORAL | Qty: 1

## 2014-07-01 MED FILL — MORPHINE SULFATE (PF) 4 MG/ML IV SOLN: 4 MG/ML | INTRAVENOUS | Qty: 1

## 2014-07-01 MED FILL — KETOROLAC TROMETHAMINE 60 MG/2ML IM SOLN: 60 MG/2ML | INTRAMUSCULAR | Qty: 2
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
## 2014-07-01 NOTE — ED Notes (Signed)
See paper chart epic computer downtime.    Kristine GarbeJoann Belia Febo, RN  07/01/14 [phone]

## 2014-07-01 NOTE — ED Notes (Signed)
See paper chart, downtime epic computer.    Kristine GarbeJoann Jaculin Rasmus, RN  07/01/14 [phone]

## 2014-07-02 MED FILL — ONDANSETRON 4 MG PO TBDP: 4 MG | ORAL | Qty: 1

## 2014-07-02 MED FILL — KETOROLAC TROMETHAMINE 60 MG/2ML IJ SOLN: 60 MG/2ML | INTRAMUSCULAR | Qty: 2

## 2014-07-02 MED FILL — MORPHINE SULFATE (PF) 4 MG/ML IV SOLN: 4 MG/ML | INTRAVENOUS | Qty: 1

## 2014-07-04 DIAGNOSIS — S8781XA Crushing injury of right lower leg, initial encounter: Secondary | ICD-10-CM | POA: Diagnosis not present

## 2014-07-07 DIAGNOSIS — F419 Anxiety disorder, unspecified: Secondary | ICD-10-CM | POA: Diagnosis not present

## 2014-07-07 DIAGNOSIS — Z950 Presence of cardiac pacemaker: Secondary | ICD-10-CM | POA: Diagnosis not present

## 2014-07-07 DIAGNOSIS — S8011XS Contusion of right lower leg, sequela: Secondary | ICD-10-CM | POA: Diagnosis not present

## 2014-07-07 DIAGNOSIS — E780 Pure hypercholesterolemia: Secondary | ICD-10-CM | POA: Diagnosis not present

## 2014-07-07 DIAGNOSIS — L03115 Cellulitis of right lower limb: Secondary | ICD-10-CM | POA: Diagnosis not present

## 2014-07-07 DIAGNOSIS — E039 Hypothyroidism, unspecified: Secondary | ICD-10-CM | POA: Diagnosis not present

## 2014-07-07 DIAGNOSIS — L03116 Cellulitis of left lower limb: Secondary | ICD-10-CM | POA: Diagnosis not present

## 2014-07-07 DIAGNOSIS — I10 Essential (primary) hypertension: Secondary | ICD-10-CM | POA: Diagnosis not present

## 2014-07-07 DIAGNOSIS — F17210 Nicotine dependence, cigarettes, uncomplicated: Secondary | ICD-10-CM | POA: Diagnosis not present

## 2014-07-07 DIAGNOSIS — Z79899 Other long term (current) drug therapy: Secondary | ICD-10-CM | POA: Diagnosis not present

## 2014-07-09 DIAGNOSIS — S8781XA Crushing injury of right lower leg, initial encounter: Secondary | ICD-10-CM | POA: Diagnosis not present

## 2014-07-09 DIAGNOSIS — R21 Rash and other nonspecific skin eruption: Secondary | ICD-10-CM | POA: Diagnosis not present

## 2014-07-18 DIAGNOSIS — S8011XS Contusion of right lower leg, sequela: Secondary | ICD-10-CM | POA: Diagnosis not present

## 2014-07-18 DIAGNOSIS — S8781XA Crushing injury of right lower leg, initial encounter: Secondary | ICD-10-CM | POA: Diagnosis not present

## 2014-07-18 DIAGNOSIS — R21 Rash and other nonspecific skin eruption: Secondary | ICD-10-CM | POA: Diagnosis not present

## 2014-07-20 DIAGNOSIS — S8011XS Contusion of right lower leg, sequela: Secondary | ICD-10-CM | POA: Diagnosis not present

## 2014-07-20 DIAGNOSIS — I10 Essential (primary) hypertension: Secondary | ICD-10-CM | POA: Diagnosis not present

## 2014-07-20 DIAGNOSIS — R21 Rash and other nonspecific skin eruption: Secondary | ICD-10-CM | POA: Diagnosis not present

## 2014-08-02 DIAGNOSIS — S8781XD Crushing injury of right lower leg, subsequent encounter: Secondary | ICD-10-CM | POA: Diagnosis not present

## 2014-08-02 DIAGNOSIS — Z4789 Encounter for other orthopedic aftercare: Secondary | ICD-10-CM | POA: Diagnosis not present

## 2014-08-02 DIAGNOSIS — S8981XD Other specified injuries of right lower leg, subsequent encounter: Secondary | ICD-10-CM | POA: Diagnosis not present

## 2014-08-04 DIAGNOSIS — E039 Hypothyroidism, unspecified: Secondary | ICD-10-CM | POA: Diagnosis not present

## 2014-08-04 DIAGNOSIS — Z79899 Other long term (current) drug therapy: Secondary | ICD-10-CM | POA: Diagnosis not present

## 2014-08-04 DIAGNOSIS — Z950 Presence of cardiac pacemaker: Secondary | ICD-10-CM | POA: Diagnosis not present

## 2014-08-04 DIAGNOSIS — I10 Essential (primary) hypertension: Secondary | ICD-10-CM | POA: Diagnosis not present

## 2014-08-04 DIAGNOSIS — E780 Pure hypercholesterolemia: Secondary | ICD-10-CM | POA: Diagnosis not present

## 2014-08-04 DIAGNOSIS — R2241 Localized swelling, mass and lump, right lower limb: Secondary | ICD-10-CM | POA: Diagnosis not present

## 2014-08-04 DIAGNOSIS — S71101D Unspecified open wound, right thigh, subsequent encounter: Secondary | ICD-10-CM | POA: Diagnosis not present

## 2014-08-04 DIAGNOSIS — F419 Anxiety disorder, unspecified: Secondary | ICD-10-CM | POA: Diagnosis not present

## 2014-08-04 DIAGNOSIS — Z87891 Personal history of nicotine dependence: Secondary | ICD-10-CM | POA: Diagnosis not present

## 2014-08-04 DIAGNOSIS — S81011A Laceration without foreign body, right knee, initial encounter: Secondary | ICD-10-CM | POA: Diagnosis not present

## 2014-08-06 DIAGNOSIS — S8981XD Other specified injuries of right lower leg, subsequent encounter: Secondary | ICD-10-CM | POA: Diagnosis not present

## 2014-08-06 DIAGNOSIS — Z4789 Encounter for other orthopedic aftercare: Secondary | ICD-10-CM | POA: Diagnosis not present

## 2014-08-06 DIAGNOSIS — S8781XD Crushing injury of right lower leg, subsequent encounter: Secondary | ICD-10-CM | POA: Diagnosis not present

## 2014-08-07 DIAGNOSIS — Z4789 Encounter for other orthopedic aftercare: Secondary | ICD-10-CM | POA: Diagnosis not present

## 2014-08-07 DIAGNOSIS — S8781XD Crushing injury of right lower leg, subsequent encounter: Secondary | ICD-10-CM | POA: Diagnosis not present

## 2014-08-07 DIAGNOSIS — S8981XD Other specified injuries of right lower leg, subsequent encounter: Secondary | ICD-10-CM | POA: Diagnosis not present

## 2014-08-08 DIAGNOSIS — S8981XD Other specified injuries of right lower leg, subsequent encounter: Secondary | ICD-10-CM | POA: Diagnosis not present

## 2014-08-08 DIAGNOSIS — Z4789 Encounter for other orthopedic aftercare: Secondary | ICD-10-CM | POA: Diagnosis not present

## 2014-08-08 DIAGNOSIS — S8781XD Crushing injury of right lower leg, subsequent encounter: Secondary | ICD-10-CM | POA: Diagnosis not present

## 2014-08-08 DIAGNOSIS — S8781XA Crushing injury of right lower leg, initial encounter: Secondary | ICD-10-CM | POA: Diagnosis not present

## 2014-08-08 DIAGNOSIS — S8011XS Contusion of right lower leg, sequela: Secondary | ICD-10-CM | POA: Diagnosis not present

## 2014-08-08 DIAGNOSIS — L98491 Non-pressure chronic ulcer of skin of other sites limited to breakdown of skin: Secondary | ICD-10-CM | POA: Diagnosis not present

## 2014-08-08 DIAGNOSIS — R21 Rash and other nonspecific skin eruption: Secondary | ICD-10-CM | POA: Diagnosis not present

## 2014-08-10 DIAGNOSIS — S8781XD Crushing injury of right lower leg, subsequent encounter: Secondary | ICD-10-CM | POA: Diagnosis not present

## 2014-08-10 DIAGNOSIS — Z4789 Encounter for other orthopedic aftercare: Secondary | ICD-10-CM | POA: Diagnosis not present

## 2014-08-10 DIAGNOSIS — S8981XD Other specified injuries of right lower leg, subsequent encounter: Secondary | ICD-10-CM | POA: Diagnosis not present

## 2014-08-13 ENCOUNTER — Ambulatory Visit (INDEPENDENT_AMBULATORY_CARE_PROVIDER_SITE_OTHER): Payer: Medicare Other | Admitting: Physician Assistant

## 2014-08-13 ENCOUNTER — Encounter: Payer: Self-pay | Admitting: Physician Assistant

## 2014-08-13 ENCOUNTER — Ambulatory Visit: Payer: Medicare Other | Admitting: Nurse Practitioner

## 2014-08-13 ENCOUNTER — Ambulatory Visit (INDEPENDENT_AMBULATORY_CARE_PROVIDER_SITE_OTHER): Payer: Medicare Other | Admitting: *Deleted

## 2014-08-13 VITALS — BP 110/60 | HR 65 | Ht 67.5 in | Wt 240.0 lb

## 2014-08-13 DIAGNOSIS — Z01810 Encounter for preprocedural cardiovascular examination: Secondary | ICD-10-CM

## 2014-08-13 DIAGNOSIS — Z4789 Encounter for other orthopedic aftercare: Secondary | ICD-10-CM | POA: Diagnosis not present

## 2014-08-13 DIAGNOSIS — Z950 Presence of cardiac pacemaker: Secondary | ICD-10-CM

## 2014-08-13 DIAGNOSIS — R001 Bradycardia, unspecified: Secondary | ICD-10-CM | POA: Diagnosis not present

## 2014-08-13 DIAGNOSIS — S8781XD Crushing injury of right lower leg, subsequent encounter: Secondary | ICD-10-CM | POA: Diagnosis not present

## 2014-08-13 DIAGNOSIS — E785 Hyperlipidemia, unspecified: Secondary | ICD-10-CM | POA: Diagnosis not present

## 2014-08-13 DIAGNOSIS — I10 Essential (primary) hypertension: Secondary | ICD-10-CM

## 2014-08-13 DIAGNOSIS — S8981XD Other specified injuries of right lower leg, subsequent encounter: Secondary | ICD-10-CM | POA: Diagnosis not present

## 2014-08-13 LAB — MDC_IDC_ENUM_SESS_TYPE_INCLINIC
Battery Remaining Longevity: 76.8 mo
Battery Voltage: 2.89 V
Brady Statistic RA Percent Paced: 91 %
Lead Channel Impedance Value: 512.5 Ohm
Lead Channel Pacing Threshold Pulse Width: 0.5 ms
Lead Channel Sensing Intrinsic Amplitude: 3.9 mV
Lead Channel Setting Pacing Amplitude: 1 V
Lead Channel Setting Pacing Amplitude: 1.375
Lead Channel Setting Sensing Sensitivity: 2 mV
MDC IDC MSMT LEADCHNL RA PACING THRESHOLD AMPLITUDE: 0.375 V
MDC IDC MSMT LEADCHNL RA PACING THRESHOLD PULSEWIDTH: 0.5 ms
MDC IDC MSMT LEADCHNL RV IMPEDANCE VALUE: 625 Ohm
MDC IDC MSMT LEADCHNL RV PACING THRESHOLD AMPLITUDE: 0.75 V
MDC IDC MSMT LEADCHNL RV SENSING INTR AMPL: 12 mV
MDC IDC PG SERIAL: 2296554
MDC IDC SESS DTM: 20160229162311
MDC IDC SET LEADCHNL RV PACING PULSEWIDTH: 0.5 ms
MDC IDC STAT BRADY RV PERCENT PACED: 3.7 %
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
## 2014-08-13 NOTE — Patient Instructions (Signed)
Keep your appointment with Dr. Cristopher Peru in April 2016.  I will send my note to Dr. Andee Poles in [place].

## 2014-08-13 NOTE — Progress Notes (Signed)
Overdue pacemaker check in clinic. Normal device function. Thresholds, sensing, impedances consistent with previous measurements. Device programmed to maximize longevity. 4 mode switches--- <1%, longest 6sec. No high ventricular rates noted. Device programmed at appropriate safety margins. Histogram distribution appropriate for patient activity level. Device programmed to optimize intrinsic conduction---changed lower rate from 65 to 60bpm. Estimated longevity 6.58yrs. ROV w/ Dr. Lovena Le 09/18/14.

## 2014-08-14 DIAGNOSIS — S8781XD Crushing injury of right lower leg, subsequent encounter: Secondary | ICD-10-CM | POA: Diagnosis not present

## 2014-08-14 DIAGNOSIS — Z4800 Encounter for change or removal of nonsurgical wound dressing: Secondary | ICD-10-CM | POA: Diagnosis not present

## 2014-08-16 DIAGNOSIS — I96 Gangrene, not elsewhere classified: Secondary | ICD-10-CM | POA: Diagnosis not present

## 2014-08-16 DIAGNOSIS — Z87891 Personal history of nicotine dependence: Secondary | ICD-10-CM | POA: Diagnosis not present

## 2014-08-16 DIAGNOSIS — E780 Pure hypercholesterolemia: Secondary | ICD-10-CM | POA: Diagnosis not present

## 2014-08-16 DIAGNOSIS — Z950 Presence of cardiac pacemaker: Secondary | ICD-10-CM | POA: Diagnosis not present

## 2014-08-16 DIAGNOSIS — Z713 Dietary counseling and surveillance: Secondary | ICD-10-CM | POA: Diagnosis not present

## 2014-08-16 DIAGNOSIS — F419 Anxiety disorder, unspecified: Secondary | ICD-10-CM | POA: Diagnosis not present

## 2014-08-16 DIAGNOSIS — J449 Chronic obstructive pulmonary disease, unspecified: Secondary | ICD-10-CM | POA: Diagnosis not present

## 2014-08-16 DIAGNOSIS — S7711XA Crushing injury of right thigh, initial encounter: Secondary | ICD-10-CM | POA: Diagnosis not present

## 2014-08-16 DIAGNOSIS — S8011XA Contusion of right lower leg, initial encounter: Secondary | ICD-10-CM | POA: Diagnosis not present

## 2014-08-16 DIAGNOSIS — I10 Essential (primary) hypertension: Secondary | ICD-10-CM | POA: Diagnosis not present

## 2014-08-16 DIAGNOSIS — L03115 Cellulitis of right lower limb: Secondary | ICD-10-CM | POA: Diagnosis not present

## 2014-08-16 DIAGNOSIS — E039 Hypothyroidism, unspecified: Secondary | ICD-10-CM | POA: Diagnosis not present

## 2014-08-16 DIAGNOSIS — Z79899 Other long term (current) drug therapy: Secondary | ICD-10-CM | POA: Diagnosis not present

## 2014-08-16 DIAGNOSIS — Z791 Long term (current) use of non-steroidal anti-inflammatories (NSAID): Secondary | ICD-10-CM | POA: Diagnosis not present

## 2014-08-16 DIAGNOSIS — L97112 Non-pressure chronic ulcer of right thigh with fat layer exposed: Secondary | ICD-10-CM | POA: Diagnosis not present

## 2014-08-16 DIAGNOSIS — E669 Obesity, unspecified: Secondary | ICD-10-CM | POA: Diagnosis not present

## 2014-08-16 DIAGNOSIS — G4733 Obstructive sleep apnea (adult) (pediatric): Secondary | ICD-10-CM | POA: Diagnosis not present

## 2014-08-17 DIAGNOSIS — Z4800 Encounter for change or removal of nonsurgical wound dressing: Secondary | ICD-10-CM | POA: Diagnosis not present

## 2014-08-17 DIAGNOSIS — S8011XS Contusion of right lower leg, sequela: Secondary | ICD-10-CM | POA: Diagnosis not present

## 2014-08-17 DIAGNOSIS — S8781XS Crushing injury of right lower leg, sequela: Secondary | ICD-10-CM | POA: Diagnosis not present

## 2014-08-17 DIAGNOSIS — L98491 Non-pressure chronic ulcer of skin of other sites limited to breakdown of skin: Secondary | ICD-10-CM | POA: Diagnosis not present

## 2014-08-20 DIAGNOSIS — S81801A Unspecified open wound, right lower leg, initial encounter: Secondary | ICD-10-CM | POA: Diagnosis not present

## 2014-08-22 ENCOUNTER — Encounter: Payer: Self-pay | Admitting: Internal Medicine

## 2014-08-24 DIAGNOSIS — S81801A Unspecified open wound, right lower leg, initial encounter: Secondary | ICD-10-CM | POA: Diagnosis not present

## 2014-08-27 DIAGNOSIS — S8011XS Contusion of right lower leg, sequela: Secondary | ICD-10-CM | POA: Diagnosis not present

## 2014-08-27 DIAGNOSIS — L98491 Non-pressure chronic ulcer of skin of other sites limited to breakdown of skin: Secondary | ICD-10-CM | POA: Diagnosis not present

## 2014-08-27 DIAGNOSIS — S81801A Unspecified open wound, right lower leg, initial encounter: Secondary | ICD-10-CM | POA: Diagnosis not present

## 2014-08-29 DIAGNOSIS — E0789 Other specified disorders of thyroid: Secondary | ICD-10-CM | POA: Diagnosis not present

## 2014-08-29 DIAGNOSIS — M25569 Pain in unspecified knee: Secondary | ICD-10-CM | POA: Diagnosis not present

## 2014-08-29 DIAGNOSIS — E032 Hypothyroidism due to medicaments and other exogenous substances: Secondary | ICD-10-CM | POA: Diagnosis not present

## 2014-08-29 DIAGNOSIS — D649 Anemia, unspecified: Secondary | ICD-10-CM | POA: Diagnosis not present

## 2014-08-30 DIAGNOSIS — S8011XS Contusion of right lower leg, sequela: Secondary | ICD-10-CM | POA: Diagnosis not present

## 2014-08-30 DIAGNOSIS — Z4800 Encounter for change or removal of nonsurgical wound dressing: Secondary | ICD-10-CM | POA: Diagnosis not present

## 2014-08-30 DIAGNOSIS — S8781XS Crushing injury of right lower leg, sequela: Secondary | ICD-10-CM | POA: Diagnosis not present

## 2014-08-30 DIAGNOSIS — L98491 Non-pressure chronic ulcer of skin of other sites limited to breakdown of skin: Secondary | ICD-10-CM | POA: Diagnosis not present

## 2014-09-04 DIAGNOSIS — S81801A Unspecified open wound, right lower leg, initial encounter: Secondary | ICD-10-CM | POA: Diagnosis not present

## 2014-09-06 DIAGNOSIS — S81802A Unspecified open wound, left lower leg, initial encounter: Secondary | ICD-10-CM | POA: Diagnosis not present

## 2014-09-07 DIAGNOSIS — Z87891 Personal history of nicotine dependence: Secondary | ICD-10-CM | POA: Diagnosis not present

## 2014-09-07 DIAGNOSIS — L03115 Cellulitis of right lower limb: Secondary | ICD-10-CM | POA: Diagnosis not present

## 2014-09-07 DIAGNOSIS — J449 Chronic obstructive pulmonary disease, unspecified: Secondary | ICD-10-CM | POA: Diagnosis not present

## 2014-09-07 DIAGNOSIS — Z79899 Other long term (current) drug therapy: Secondary | ICD-10-CM | POA: Diagnosis not present

## 2014-09-07 DIAGNOSIS — Z885 Allergy status to narcotic agent status: Secondary | ICD-10-CM | POA: Diagnosis not present

## 2014-09-07 DIAGNOSIS — M7989 Other specified soft tissue disorders: Secondary | ICD-10-CM | POA: Diagnosis not present

## 2014-09-07 DIAGNOSIS — Z886 Allergy status to analgesic agent status: Secondary | ICD-10-CM | POA: Diagnosis not present

## 2014-09-07 DIAGNOSIS — G4733 Obstructive sleep apnea (adult) (pediatric): Secondary | ICD-10-CM | POA: Diagnosis not present

## 2014-09-07 DIAGNOSIS — R609 Edema, unspecified: Secondary | ICD-10-CM | POA: Diagnosis not present

## 2014-09-07 DIAGNOSIS — Z950 Presence of cardiac pacemaker: Secondary | ICD-10-CM | POA: Diagnosis not present

## 2014-09-07 DIAGNOSIS — R079 Chest pain, unspecified: Secondary | ICD-10-CM | POA: Diagnosis not present

## 2014-09-07 DIAGNOSIS — F419 Anxiety disorder, unspecified: Secondary | ICD-10-CM | POA: Diagnosis not present

## 2014-09-07 DIAGNOSIS — E780 Pure hypercholesterolemia: Secondary | ICD-10-CM | POA: Diagnosis not present

## 2014-09-07 DIAGNOSIS — E039 Hypothyroidism, unspecified: Secondary | ICD-10-CM | POA: Diagnosis not present

## 2014-09-07 DIAGNOSIS — I10 Essential (primary) hypertension: Secondary | ICD-10-CM | POA: Diagnosis not present

## 2014-09-07 DIAGNOSIS — R9431 Abnormal electrocardiogram [ECG] [EKG]: Secondary | ICD-10-CM | POA: Diagnosis not present

## 2014-09-07 DIAGNOSIS — J439 Emphysema, unspecified: Secondary | ICD-10-CM | POA: Diagnosis not present

## 2014-09-10 DIAGNOSIS — S81801A Unspecified open wound, right lower leg, initial encounter: Secondary | ICD-10-CM | POA: Diagnosis not present

## 2014-09-13 DIAGNOSIS — S81801A Unspecified open wound, right lower leg, initial encounter: Secondary | ICD-10-CM | POA: Diagnosis not present

## 2014-09-18 ENCOUNTER — Encounter: Payer: Medicare Other | Admitting: Internal Medicine

## 2014-09-18 DIAGNOSIS — S81801D Unspecified open wound, right lower leg, subsequent encounter: Secondary | ICD-10-CM | POA: Diagnosis not present

## 2014-09-20 DIAGNOSIS — S81801D Unspecified open wound, right lower leg, subsequent encounter: Secondary | ICD-10-CM | POA: Diagnosis not present

## 2014-09-25 DIAGNOSIS — S81801D Unspecified open wound, right lower leg, subsequent encounter: Secondary | ICD-10-CM | POA: Diagnosis not present

## 2014-09-27 DIAGNOSIS — L98491 Non-pressure chronic ulcer of skin of other sites limited to breakdown of skin: Secondary | ICD-10-CM | POA: Diagnosis not present

## 2014-09-27 DIAGNOSIS — S81801D Unspecified open wound, right lower leg, subsequent encounter: Secondary | ICD-10-CM | POA: Diagnosis not present

## 2014-09-27 DIAGNOSIS — S8781XD Crushing injury of right lower leg, subsequent encounter: Secondary | ICD-10-CM | POA: Diagnosis not present

## 2014-10-01 DIAGNOSIS — E032 Hypothyroidism due to medicaments and other exogenous substances: Secondary | ICD-10-CM | POA: Diagnosis not present

## 2014-10-01 DIAGNOSIS — S81801D Unspecified open wound, right lower leg, subsequent encounter: Secondary | ICD-10-CM | POA: Diagnosis not present

## 2014-10-04 DIAGNOSIS — S81801D Unspecified open wound, right lower leg, subsequent encounter: Secondary | ICD-10-CM | POA: Diagnosis not present

## 2014-10-09 DIAGNOSIS — S81801D Unspecified open wound, right lower leg, subsequent encounter: Secondary | ICD-10-CM | POA: Diagnosis not present

## 2014-10-11 DIAGNOSIS — S81801D Unspecified open wound, right lower leg, subsequent encounter: Secondary | ICD-10-CM | POA: Diagnosis not present

## 2014-10-11 DIAGNOSIS — L98491 Non-pressure chronic ulcer of skin of other sites limited to breakdown of skin: Secondary | ICD-10-CM | POA: Diagnosis not present

## 2014-10-16 DIAGNOSIS — L98491 Non-pressure chronic ulcer of skin of other sites limited to breakdown of skin: Secondary | ICD-10-CM | POA: Diagnosis not present

## 2014-10-16 DIAGNOSIS — Z87828 Personal history of other (healed) physical injury and trauma: Secondary | ICD-10-CM | POA: Diagnosis not present

## 2014-10-19 DIAGNOSIS — Z87828 Personal history of other (healed) physical injury and trauma: Secondary | ICD-10-CM | POA: Diagnosis not present

## 2014-10-19 DIAGNOSIS — L98491 Non-pressure chronic ulcer of skin of other sites limited to breakdown of skin: Secondary | ICD-10-CM | POA: Diagnosis not present

## 2014-11-01 DIAGNOSIS — L98491 Non-pressure chronic ulcer of skin of other sites limited to breakdown of skin: Secondary | ICD-10-CM | POA: Diagnosis not present

## 2014-11-01 DIAGNOSIS — Z87828 Personal history of other (healed) physical injury and trauma: Secondary | ICD-10-CM | POA: Diagnosis not present

## 2014-11-06 DIAGNOSIS — L98491 Non-pressure chronic ulcer of skin of other sites limited to breakdown of skin: Secondary | ICD-10-CM | POA: Diagnosis not present

## 2014-11-06 DIAGNOSIS — Z87828 Personal history of other (healed) physical injury and trauma: Secondary | ICD-10-CM | POA: Diagnosis not present

## 2014-11-13 DIAGNOSIS — L98491 Non-pressure chronic ulcer of skin of other sites limited to breakdown of skin: Secondary | ICD-10-CM | POA: Diagnosis not present

## 2014-11-13 DIAGNOSIS — Z87828 Personal history of other (healed) physical injury and trauma: Secondary | ICD-10-CM | POA: Diagnosis not present

## 2014-11-22 DIAGNOSIS — S9781XD Crushing injury of right foot, subsequent encounter: Secondary | ICD-10-CM | POA: Diagnosis not present

## 2014-11-22 DIAGNOSIS — Z4800 Encounter for change or removal of nonsurgical wound dressing: Secondary | ICD-10-CM | POA: Diagnosis not present

## 2014-11-22 DIAGNOSIS — S8011XD Contusion of right lower leg, subsequent encounter: Secondary | ICD-10-CM | POA: Diagnosis not present

## 2014-12-21 DIAGNOSIS — Z961 Presence of intraocular lens: Secondary | ICD-10-CM | POA: Diagnosis not present

## 2014-12-21 DIAGNOSIS — H26499 Other secondary cataract, unspecified eye: Secondary | ICD-10-CM | POA: Diagnosis not present

## 2014-12-21 DIAGNOSIS — H04123 Dry eye syndrome of bilateral lacrimal glands: Secondary | ICD-10-CM | POA: Diagnosis not present

## 2014-12-21 DIAGNOSIS — H3530 Unspecified macular degeneration: Secondary | ICD-10-CM | POA: Diagnosis not present

## 2015-01-28 ENCOUNTER — Telehealth: Payer: Self-pay | Admitting: Internal Medicine
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
## 2015-01-28 NOTE — Telephone Encounter (Signed)
Pt c/o BP issue: STAT if pt c/o blurred vision, one-sided weakness or slurred speech  1. What are your last 5 BP readings? 156/113,,hr is 110   2. Are you having any other symptoms (ex. Dizziness, headache, blurred vision, passed out)? headache  3. What is your BP issue? Pt want to know is the heat causing her bp to be high. Please call pt.

## 2015-01-29 DIAGNOSIS — E039 Hypothyroidism, unspecified: Secondary | ICD-10-CM | POA: Diagnosis not present

## 2015-01-29 DIAGNOSIS — I440 Atrioventricular block, first degree: Secondary | ICD-10-CM | POA: Diagnosis not present

## 2015-01-29 DIAGNOSIS — J449 Chronic obstructive pulmonary disease, unspecified: Secondary | ICD-10-CM | POA: Diagnosis not present

## 2015-01-29 DIAGNOSIS — R001 Bradycardia, unspecified: Secondary | ICD-10-CM | POA: Diagnosis not present

## 2015-01-29 DIAGNOSIS — I10 Essential (primary) hypertension: Secondary | ICD-10-CM | POA: Diagnosis not present

## 2015-01-29 DIAGNOSIS — Z885 Allergy status to narcotic agent status: Secondary | ICD-10-CM | POA: Diagnosis not present

## 2015-01-29 DIAGNOSIS — Z79899 Other long term (current) drug therapy: Secondary | ICD-10-CM | POA: Diagnosis not present

## 2015-01-29 DIAGNOSIS — E780 Pure hypercholesterolemia: Secondary | ICD-10-CM | POA: Diagnosis not present

## 2015-01-29 DIAGNOSIS — F419 Anxiety disorder, unspecified: Secondary | ICD-10-CM | POA: Diagnosis not present

## 2015-01-29 DIAGNOSIS — E785 Hyperlipidemia, unspecified: Secondary | ICD-10-CM | POA: Diagnosis not present

## 2015-01-29 DIAGNOSIS — G4733 Obstructive sleep apnea (adult) (pediatric): Secondary | ICD-10-CM | POA: Diagnosis not present

## 2015-01-29 DIAGNOSIS — R0789 Other chest pain: Secondary | ICD-10-CM | POA: Diagnosis not present

## 2015-01-29 DIAGNOSIS — R079 Chest pain, unspecified: Secondary | ICD-10-CM | POA: Diagnosis not present

## 2015-01-29 DIAGNOSIS — Z950 Presence of cardiac pacemaker: Secondary | ICD-10-CM | POA: Diagnosis not present

## 2015-01-30 ENCOUNTER — Telehealth: Payer: Self-pay | Admitting: Internal Medicine

## 2015-01-30 DIAGNOSIS — R079 Chest pain, unspecified: Secondary | ICD-10-CM | POA: Diagnosis not present

## 2015-01-30 DIAGNOSIS — I10 Essential (primary) hypertension: Secondary | ICD-10-CM | POA: Diagnosis not present

## 2015-01-30 DIAGNOSIS — E785 Hyperlipidemia, unspecified: Secondary | ICD-10-CM | POA: Diagnosis not present

## 2015-01-30 NOTE — Telephone Encounter (Signed)
New message     Pt has pacemaker and pt is admitted to [facility] in [facility] but waiting to be discharged Pt states heart rate was racing and felt like a lot of pressure on chest Pt had stress test w/dye Pt states she was advised by hospital that she needs to be seen by Dr Lovena Le in near future Please call to discuss

## 2015-01-30 NOTE — Telephone Encounter (Signed)
Spoke with the patient and she said she went to the hospital and the stress test was good.  She is going to have all the records sent to Dr Lovena Le

## 2015-02-07 ENCOUNTER — Observation Stay (HOSPITAL_COMMUNITY)
Admission: EM | Admit: 2015-02-07 | Discharge: 2015-02-09 | Disposition: A | Discharge status: Home / Self Care | Payer: Medicare Other | Attending: Cardiovascular Disease | Admitting: Cardiovascular Disease

## 2015-02-07 ENCOUNTER — Emergency Department (HOSPITAL_COMMUNITY): Payer: Medicare Other

## 2015-02-07 ENCOUNTER — Ambulatory Visit (INDEPENDENT_AMBULATORY_CARE_PROVIDER_SITE_OTHER): Payer: Medicare Other | Admitting: *Deleted

## 2015-02-07 ENCOUNTER — Encounter (HOSPITAL_COMMUNITY): Payer: Self-pay | Admitting: Family Medicine

## 2015-02-07 ENCOUNTER — Telehealth: Payer: Self-pay

## 2015-02-07 DIAGNOSIS — Z87891 Personal history of nicotine dependence: Secondary | ICD-10-CM | POA: Insufficient documentation

## 2015-02-07 DIAGNOSIS — E039 Hypothyroidism, unspecified: Secondary | ICD-10-CM | POA: Diagnosis present

## 2015-02-07 DIAGNOSIS — J449 Chronic obstructive pulmonary disease, unspecified: Secondary | ICD-10-CM | POA: Diagnosis not present

## 2015-02-07 DIAGNOSIS — Z23 Encounter for immunization: Secondary | ICD-10-CM | POA: Insufficient documentation

## 2015-02-07 DIAGNOSIS — R0602 Shortness of breath: Secondary | ICD-10-CM | POA: Diagnosis not present

## 2015-02-07 DIAGNOSIS — G4733 Obstructive sleep apnea (adult) (pediatric): Secondary | ICD-10-CM | POA: Diagnosis present

## 2015-02-07 DIAGNOSIS — R001 Bradycardia, unspecified: Secondary | ICD-10-CM | POA: Diagnosis not present

## 2015-02-07 DIAGNOSIS — I2510 Atherosclerotic heart disease of native coronary artery without angina pectoris: Secondary | ICD-10-CM | POA: Insufficient documentation

## 2015-02-07 DIAGNOSIS — R072 Precordial pain: Secondary | ICD-10-CM | POA: Diagnosis not present

## 2015-02-07 DIAGNOSIS — E669 Obesity, unspecified: Secondary | ICD-10-CM | POA: Diagnosis not present

## 2015-02-07 DIAGNOSIS — I10 Essential (primary) hypertension: Secondary | ICD-10-CM | POA: Diagnosis not present

## 2015-02-07 DIAGNOSIS — G4730 Sleep apnea, unspecified: Secondary | ICD-10-CM | POA: Insufficient documentation

## 2015-02-07 DIAGNOSIS — Z6836 Body mass index (BMI) 36.0-36.9, adult: Secondary | ICD-10-CM | POA: Diagnosis not present

## 2015-02-07 DIAGNOSIS — E0789 Other specified disorders of thyroid: Secondary | ICD-10-CM | POA: Diagnosis not present

## 2015-02-07 DIAGNOSIS — R079 Chest pain, unspecified: Secondary | ICD-10-CM | POA: Diagnosis not present

## 2015-02-07 DIAGNOSIS — E7800 Pure hypercholesterolemia, unspecified: Secondary | ICD-10-CM | POA: Diagnosis present

## 2015-02-07 DIAGNOSIS — Z79899 Other long term (current) drug therapy: Secondary | ICD-10-CM | POA: Diagnosis not present

## 2015-02-07 DIAGNOSIS — K219 Gastro-esophageal reflux disease without esophagitis: Secondary | ICD-10-CM | POA: Diagnosis not present

## 2015-02-07 DIAGNOSIS — Z950 Presence of cardiac pacemaker: Secondary | ICD-10-CM | POA: Diagnosis not present

## 2015-02-07 DIAGNOSIS — I209 Angina pectoris, unspecified: Secondary | ICD-10-CM

## 2015-02-07 DIAGNOSIS — R0789 Other chest pain: Secondary | ICD-10-CM

## 2015-02-07 DIAGNOSIS — E032 Hypothyroidism due to medicaments and other exogenous substances: Secondary | ICD-10-CM | POA: Diagnosis not present

## 2015-02-07 DIAGNOSIS — R002 Palpitations: Secondary | ICD-10-CM

## 2015-02-07 DIAGNOSIS — E789 Disorder of lipoprotein metabolism, unspecified: Secondary | ICD-10-CM | POA: Diagnosis not present

## 2015-02-07 LAB — BASIC METABOLIC PANEL
Anion gap: 9 (ref 5–15)
BUN: 9 mg/dL (ref 6–20)
CHLORIDE: 105 mmol/L (ref 101–111)
CO2: 25 mmol/L (ref 22–32)
CREATININE: 0.89 mg/dL (ref 0.44–1.00)
Calcium: 9.1 mg/dL (ref 8.9–10.3)
GFR calc Af Amer: >60 mL/min (ref >60)
GFR calc non Af Amer: >60 mL/min (ref >60)
Glucose, Bld: 100 mg/dL — ABNORMAL HIGH (ref 65–99)
Potassium: 4.3 mmol/L (ref 3.5–5.1)
SODIUM: 139 mmol/L (ref 135–145)

## 2015-02-07 LAB — CUP PACEART INCLINIC DEVICE CHECK
Battery Remaining Longevity: 72 mo
Battery Voltage: 2.87 V
Brady Statistic RA Percent Paced: 99 %
Brady Statistic RV Percent Paced: 0.9 %
Lead Channel Pacing Threshold Amplitude: 0.375 V
Lead Channel Pacing Threshold Amplitude: 0.875 V
Lead Channel Sensing Intrinsic Amplitude: 4 mV
Lead Channel Setting Pacing Amplitude: 1.125
Lead Channel Setting Pacing Amplitude: 1.375
Lead Channel Setting Pacing Pulse Width: 0.5 ms
MDC IDC MSMT LEADCHNL RA PACING THRESHOLD PULSEWIDTH: 0.5 ms
MDC IDC MSMT LEADCHNL RV PACING THRESHOLD PULSEWIDTH: 0.5 ms
MDC IDC MSMT LEADCHNL RV SENSING INTR AMPL: 11.7 mV
MDC IDC PG SERIAL: 2296554
MDC IDC SESS DTM: 20160825171740
MDC IDC SET LEADCHNL RV SENSING SENSITIVITY: 2 mV

## 2015-02-07 LAB — CREATININE, SERUM
CREATININE: 0.93 mg/dL (ref 0.44–1.00)
GFR calc Af Amer: >60 mL/min (ref >60)
GFR calc non Af Amer: 60 mL/min — ABNORMAL LOW (ref >60)

## 2015-02-07 LAB — CBC
HCT: 40.9 % (ref 36.0–46.0)
Hemoglobin: 13.7 g/dL (ref 12.0–15.0)
MCH: 30.6 pg (ref 26.0–34.0)
MCHC: 33.5 g/dL (ref 30.0–36.0)
MCV: 91.5 fL (ref 78.0–100.0)
PLATELETS: 286 10*3/uL (ref 150–400)
RBC: 4.47 MIL/uL (ref 3.87–5.11)
RDW: 14 % (ref 11.5–15.5)
WBC: 7.4 10*3/uL (ref 4.0–10.5)

## 2015-02-07 LAB — TROPONIN I

## 2015-02-07 LAB — I-STAT TROPONIN, ED: Troponin i, poc: 0 ng/mL (ref 0.00–0.08)

## 2015-02-07 LAB — PROTIME-INR
INR: 0.99 (ref 0.00–1.49)
PROTHROMBIN TIME: 13.3 s (ref 11.6–15.2)

## 2015-02-07 LAB — TSH: TSH: 25.813 u[IU]/mL — ABNORMAL HIGH (ref 0.350–4.500)

## 2015-02-07 MED ORDER — LEVOTHYROXINE SODIUM 75 MCG PO TABS
175.0000 ug | ORAL_TABLET | Freq: Every day | ORAL | Status: DC
  Administered 2015-02-08 – 2015-02-09 (×2): 175 ug via ORAL
  Filled 2015-02-07 (×4): qty 1

## 2015-02-07 MED ORDER — HYDROCODONE-ACETAMINOPHEN 5-325 MG PO TABS
1.0000 | ORAL_TABLET | Freq: Four times a day (QID) | ORAL | Status: DC | PRN
  Administered 2015-02-07 – 2015-02-08 (×3): 1 via ORAL
  Filled 2015-02-07 (×3): qty 1

## 2015-02-07 MED ORDER — SODIUM CHLORIDE 0.9 % IV SOLN: 250.0000 mL | INTRAVENOUS | Status: DC | PRN

## 2015-02-07 MED ORDER — PAROXETINE HCL 20 MG PO TABS
20.0000 mg | ORAL_TABLET | Freq: Two times a day (BID) | ORAL | Status: DC
  Administered 2015-02-08 – 2015-02-09 (×3): 20 mg via ORAL
  Filled 2015-02-07 (×3): qty 1

## 2015-02-07 MED ORDER — SODIUM CHLORIDE 0.9 % IJ SOLN
3.0000 mL | Freq: Two times a day (BID) | INTRAMUSCULAR | Status: DC
  Administered 2015-02-07: 3 mL via INTRAVENOUS

## 2015-02-07 MED ORDER — SODIUM CHLORIDE 0.9 % WEIGHT BASED INFUSION
1.0000 mL/kg/h | INTRAVENOUS | Status: DC
  Administered 2015-02-08 (×2): 1 mL/kg/h via INTRAVENOUS

## 2015-02-07 MED ORDER — SODIUM CHLORIDE 0.9 % WEIGHT BASED INFUSION
3.0000 mL/kg/h | INTRAVENOUS | Status: DC
  Administered 2015-02-08: 3 mL/kg/h via INTRAVENOUS

## 2015-02-07 MED ORDER — ACETAMINOPHEN 325 MG PO TABS: 650.0000 mg | ORAL_TABLET | ORAL | Status: DC | PRN

## 2015-02-07 MED ORDER — LISINOPRIL 10 MG PO TABS
10.0000 mg | ORAL_TABLET | Freq: Every day | ORAL | Status: DC
  Administered 2015-02-07 – 2015-02-09 (×3): 10 mg via ORAL
  Filled 2015-02-07 (×4): qty 1

## 2015-02-07 MED ORDER — NITROGLYCERIN 0.4 MG SL SUBL
0.4000 mg | SUBLINGUAL_TABLET | SUBLINGUAL | Status: DC | PRN
  Administered 2015-02-07 (×2): 0.4 mg via SUBLINGUAL

## 2015-02-07 MED ORDER — HEPARIN SODIUM (PORCINE) 5000 UNIT/ML IJ SOLN
5000.0000 [IU] | Freq: Three times a day (TID) | INTRAMUSCULAR | Status: DC
  Administered 2015-02-07 – 2015-02-08 (×2): 5000 [IU] via SUBCUTANEOUS
  Filled 2015-02-07 (×2): qty 1

## 2015-02-07 MED ORDER — INFLUENZA VAC SPLIT QUAD 0.5 ML IM SUSY
0.5000 mL | PREFILLED_SYRINGE | INTRAMUSCULAR | Status: AC
  Administered 2015-02-08: 0.5 mL via INTRAMUSCULAR
  Filled 2015-02-07: qty 0.5

## 2015-02-07 MED ORDER — ALBUTEROL SULFATE HFA 108 (90 BASE) MCG/ACT IN AERS: 1.0000 | INHALATION_SPRAY | RESPIRATORY_TRACT | Status: DC | PRN

## 2015-02-07 MED ORDER — ASPIRIN 300 MG RE SUPP: 300.0000 mg | RECTAL | Status: AC

## 2015-02-07 MED ORDER — ASPIRIN 81 MG PO CHEW
81.0000 mg | CHEWABLE_TABLET | ORAL | Status: AC
  Administered 2015-02-08: 81 mg via ORAL
  Filled 2015-02-07: qty 1

## 2015-02-07 MED ORDER — SIMVASTATIN 40 MG PO TABS
40.0000 mg | ORAL_TABLET | Freq: Every day | ORAL | Status: DC
  Administered 2015-02-07 – 2015-02-08 (×2): 40 mg via ORAL
  Filled 2015-02-07 (×2): qty 1

## 2015-02-07 MED ORDER — ALBUTEROL SULFATE (2.5 MG/3ML) 0.083% IN NEBU
2.5000 mg | INHALATION_SOLUTION | RESPIRATORY_TRACT | Status: DC | PRN
Start: 2015-02-07 — End: 2015-02-09

## 2015-02-07 MED ORDER — ONDANSETRON HCL 4 MG/2ML IJ SOLN: 4.0000 mg | Freq: Four times a day (QID) | INTRAMUSCULAR | Status: DC | PRN

## 2015-02-07 MED ORDER — ASPIRIN 81 MG PO CHEW
324.0000 mg | CHEWABLE_TABLET | ORAL | Status: AC
  Administered 2015-02-07: 324 mg via ORAL
  Filled 2015-02-07: qty 4

## 2015-02-07 MED ORDER — SODIUM CHLORIDE 0.9 % IJ SOLN: 3.0000 mL | INTRAMUSCULAR | Status: DC | PRN

## 2015-02-07 MED ORDER — TIOTROPIUM BROMIDE MONOHYDRATE 18 MCG IN CAPS
18.0000 ug | ORAL_CAPSULE | Freq: Every day | RESPIRATORY_TRACT | Status: DC
  Administered 2015-02-08 – 2015-02-09 (×2): 18 ug via RESPIRATORY_TRACT
  Filled 2015-02-07: qty 5

## 2015-02-07 MED ORDER — LORAZEPAM 0.5 MG PO TABS: 0.5000 mg | ORAL_TABLET | ORAL | Status: DC | PRN

## 2015-02-07 MED ORDER — ASPIRIN EC 81 MG PO TBEC
81.0000 mg | DELAYED_RELEASE_TABLET | Freq: Every day | ORAL | Status: DC
  Administered 2015-02-08 – 2015-02-09 (×2): 81 mg via ORAL
  Filled 2015-02-07 (×2): qty 1

## 2015-02-07 NOTE — ED Notes (Signed)
Pt sts rapid heart beat and chest pain that started today. sts it has been intermittent 2 weeks. sts sent here by her doctor. sts chest pressure.

## 2015-02-07 NOTE — ED Provider Notes (Signed)
History   Chief Complaint  Patient presents with  . Chest Pain  . Tachycardia    HPI 74 year old female with past medical history as below notable for hypertension, COPD, symptomatic bradycardia status post pacemaker who presents to ED today from cardiology office for evaluation of chest pressure and tachycardia. Patient states she has had these symptoms for the past week and a half. She also notes having recent admission early in for the symptoms where they ruled her out for ACS and obtain a stress test which was unremarkable. Since that time patient reports having more frequent symptoms and states today cardiology clinic she had dizziness, nausea with her palpitations with increased heaviness in her chest. Patient denies any recent illness, diaphoresis, leg pain, leg swelling or other symptoms. She currently states she has some mild heaviness in her chest but otherwise feels fine. No other modifying factors reported. No other complaints at this time.    Past medical/surgical history, social history, medications, allergies and FH have been reviewed with patient and/or in documentation.  Past Medical History  Diagnosis Date  . HTN (hypertension)   . GERD (gastroesophageal reflux disease)   . COPD (chronic obstructive pulmonary disease)   . Hypothyroidism   . Sleep apnea   . Diverticulosis of colon   . Other specified cardiac dysrhythmias(427.89)    Past Surgical History  Procedure Laterality Date  . Pacemaker insertion  [place]  . Stomach surgery    . Vaginal hysterectomy    . Eyelid surgery     Family History  Problem Relation Age of Onset  . Angina    . Diabetes    . Cancer    . GI problems     Social History  Substance Use Topics  . Smoking status: Former Smoker    Types: Cigarettes    Quit date: 06/16/2011  . Smokeless tobacco: Never Used  . Alcohol Use: No     Review of Systems Constitutional: Negative for fever, chills and fatigue. - diaphoresis HENT:  Negative for congestion, rhinorrhea and sore throat.   Eyes: Negative for visual disturbance.  Respiratory: - SOB, Negative for cough and wheezing.   Cardiovascular: + for CP, palps.  Gastrointestinal: + nausea, neg vomiting; denies abdominal pain and diarrhea.  Genitourinary: Negative for flank pain, dysuria, frequency.  Musculoskeletal: Negative for back pain, neck pain and neck stiffness, leg pain/swelling.  Skin: Negative for rash.  Neurological: + for dizziness and neg headaches.  All other systems reviewed and are negative.   Physical Exam  Physical Exam  ED Triage Vitals  Enc Vitals Group     BP 02/07/15 1511 114/86 mmHg     Pulse Rate 02/07/15 1511 58     Resp 02/07/15 1511 18     Temp 02/07/15 1511 98 F (36.7 C)     Temp src --      SpO2 02/07/15 1511 98 %     Weight --      Height --      Head Cir --      Peak Flow --      Pain Score 02/07/15 1509 5     Pain Loc --      Pain Edu? --      Excl. in [place]? --    Constitutional: Chronically ill appearing 74 year old female in no apparent distress  Head: Normocephalic and atraumatic.  Eyes: Extraocular motion intact, no scleral icterus Neck: Supple without meningismus, mass, or overt JVD.  Respiratory: Effort normal and breath sounds normal. No respiratory distress. Chest wall: non-reproducible CP on exam CV: bradycardic rate (50-60s) and reg rhythm, no obvious murmurs.  Pulses +2 and symmetric. Euvolemic on exam. Abdomen: Soft, non-tender, non-distended MSK: Extremities are atraumatic without deformity, ROM intact. No calf TTP. Skin: Warm, dry, intact. Neuro: Alert and oriented, no motor deficit noted Psychiatric: Mood and affect are normal.    ED Course  Procedures   Labs Reviewed  BASIC METABOLIC PANEL - Abnormal; Notable for the following:    Glucose, Bld 100 (*)    All other components within normal limits  CREATININE, SERUM - Abnormal; Notable for the following:    GFR calc non Af Amer 60 (*)    All  other components within normal limits  CBC  PROTIME-INR  TROPONIN I  COMPREHENSIVE METABOLIC PANEL  LIPID PANEL  TSH  TROPONIN I  TROPONIN I  HEMOGLOBIN A1C  CBC  PROTIME-INR  CBC  I-STAT TROPOININ, ED   I personally reviewed and interpreted all labs.  Dg Chest 2 View  02/07/2015   CLINICAL DATA:  Mid chest pain for 2 weeks with shortness of breath, history COPD, hypertension, GERD, former smoker  EXAM: CHEST  2 VIEW  COMPARISON:  04/22/2010  FINDINGS: RIGHT subclavian transvenous pacemaker leads project at RIGHT atrium and RIGHT ventricle.  Perigastric surgical clips LEFT upper quadrant.  Normal heart size, mediastinal contours and pulmonary vascularity.  Atherosclerotic calcification aorta.  Focal eventration RIGHT diaphragm stable.  Minimal central chronic peribronchial thickening.  No pulmonary infiltrate, pleural effusion or pneumothorax.  Linear subsegmental atelectasis at LEFT base.  No acute osseous findings.  IMPRESSION: Minimal LEFT basilar atelectasis.  No acute infiltrate.   Electronically Signed   By: Lavonia Dana M.D.   On: 02/07/2015 15:43   I personally viewed above image(s) which were used in my medical decision making. Formal interpretations by Radiology.   EKG Interpretation  Date/Time:  Thursday February 07 2015 15:10:45 EDT Ventricular Rate:  64 PR Interval:  214 QRS Duration: 74 QT Interval:  370 QTC Calculation: 381 R Axis:   53 Text Interpretation:  Atrial-paced rhythm with prolonged AV conduction ST \\T \ T wave abnormality, consider inferior ischemia ST \\T \ T wave abnormality, consider anterolateral ischemia `t inv inf/lat new, as compared to prior ecg.  Confirmed by Ashok Cordia  MD, Lennette Bihari ([id number]) on 02/07/2015 4:28:45 PM       MDM: SHRESHTA MEDLEY is a 74 y.o. female who p/w CP. ABCs intact. HDS, NAD. Cardiac w/u initiated.  Patient sent from cardiology office for evaluation of chest pressure and tachycardia. EKG is as above and shows new T-wave inversions  inferiorly and laterally. Patient is still having chest pressure in ED. Patient is clinically stable and in no apparent distress otherwise.  Discussed case with cardiology and they saw patient in ED and will plan to admit the patient to their service.  Clinical Impression: 1. Chest pressure    Disposition: Admit  Condition: stable  I have discussed the results, Dx and Tx plan with the pt(& family if present). He/she/they expressed understanding and agree(s) with the plan.  Pt seen in conjunction with Dr. Meryle Ready, [facility] Resident - PGY-3         Kirstie Peri, MD 02/07/15 [place], MD 02/10/15 1536

## 2015-02-07 NOTE — Progress Notes (Signed)
Pacemaker check add-on in clinic. Normal device function. Thresholds, sensing, impedances consistent with previous measurements. Device programmed to maximize longevity. No mode switches or high ventricular rates noted. 11 "consecutive PVC" episodes, reviewed with [facility] rep. EGMs suggest AT and ?5bts NSVT. All episodes from 01/18/15 and earlier. Device programmed at appropriate safety margins. Histogram distribution appropriate for patient activity level. Device programmed to optimize intrinsic conduction. AT detection rate reprogrammed from 170bpm to 150bpm. Consecutive PVCs episode trigger programmed off, high V rate EGM max duration reprogrammed from 87min to 33min. PVAB reprogrammed from 140ms to 139ms, rate responsive PVARP reprogrammed off. Estimated longevity 5.9 years. Patient education completed. ROV with GT on 03/14/2015.
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
## 2015-02-07 NOTE — Telephone Encounter (Signed)
Patient walked in complaining of BP issues and dizziness.

## 2015-02-07 NOTE — ED Notes (Signed)
Attempted report 

## 2015-02-07 NOTE — ED Notes (Signed)
Cardiology paged.  

## 2015-02-07 NOTE — Telephone Encounter (Signed)
Sheila Murray getting records from [place]

## 2015-02-07 NOTE — Telephone Encounter (Signed)
Patient walk in with complaints of "heart racing, BP issues, mid-sternal chest pressure/heaviness feeling.  S/P pacemaker 2014.  Came into office to pick up paperwork/test results. States that when she walked in she started to have another episode of Chest Pressure discomfort and feeling like her heart was racing and dizziness. Patient was seated in Registration area immediately after getting off elevators. Nurses came to check on her and took her by wheelchair to exam room. BP 160/90, HR 65 (regular and strong pulse and heartbeat per auscultation), POX 97%. Denies SOB. States over past couple of weeks she has been experiencing this type of discomfort intermittently, not associated with exertion. Usually resolves on own but always comes back . Was seen in ED a week ago at [facility]. States they admitted her and placed her a monitor. States she went through a stress test that had negative results. States they discharged her the next day and she is unaware of what the working diagnosis was from [place]. She missed her April appointment with Dr. Lovena Le and she has not been seen by [facility] "in quite a while". She has not rescheduled any of her missed appointments. Her BP is "handled" by PCP and usually runs 110/60, per patient. She has appt with PCP next week. Denies recent stress except for "this is the busy season at work". Denies recent weight gain.  Raquel Sarna from [facility] came to check Device - Normal, no alerts noted, values consistent, no evidence of AFib, leads working, some PVC activity in July noted on recordings but nothing in August - per Raquel Sarna. Dr. Irish Lack reviewed information and advised patient to go to ED at [facility] for active symptoms she is experiencing. Nurse stressed importance of need for diagnostic work up at ED because even though Device may be checking out, her sx are active and significant enough to cause her discomfort and whatever is causing her sx may not be related to Device at all.  Appointment provided to patient for 9/29 @ 11:45 am with Dr. Lovena Le as reschedule from missed April appointment. Patient declined to follow the advisement to go to ED. Husband was with patient and he was transporting her to/from office. Recommended that she go on to ED to be checked out. Will route to Dr. Lovena Le as Juluis Rainier.

## 2015-02-08 ENCOUNTER — Encounter (HOSPITAL_COMMUNITY)
Admission: EM | Disposition: A | Discharge status: Home / Self Care | Payer: Self-pay | Source: Home / Self Care | Attending: Emergency Medicine

## 2015-02-08 DIAGNOSIS — R0602 Shortness of breath: Secondary | ICD-10-CM | POA: Diagnosis not present

## 2015-02-08 DIAGNOSIS — Z23 Encounter for immunization: Secondary | ICD-10-CM | POA: Diagnosis not present

## 2015-02-08 DIAGNOSIS — I2510 Atherosclerotic heart disease of native coronary artery without angina pectoris: Secondary | ICD-10-CM

## 2015-02-08 DIAGNOSIS — R002 Palpitations: Secondary | ICD-10-CM | POA: Diagnosis not present

## 2015-02-08 DIAGNOSIS — Z950 Presence of cardiac pacemaker: Secondary | ICD-10-CM | POA: Diagnosis not present

## 2015-02-08 DIAGNOSIS — E780 Pure hypercholesterolemia: Secondary | ICD-10-CM

## 2015-02-08 DIAGNOSIS — R072 Precordial pain: Secondary | ICD-10-CM | POA: Diagnosis not present

## 2015-02-08 HISTORY — PX: CARDIAC CATHETERIZATION: SHX172

## 2015-02-08 LAB — CBC
HCT: 37.8 % (ref 36.0–46.0)
HCT: 37.3 % (ref 36.0–46.0)
Hemoglobin: 12.7 g/dL (ref 12.0–15.0)
Hemoglobin: 12.4 g/dL (ref 12.0–15.0)
MCH: 31.2 pg (ref 26.0–34.0)
MCH: 30.8 pg (ref 26.0–34.0)
MCHC: 33.6 g/dL (ref 30.0–36.0)
MCHC: 33.2 g/dL (ref 30.0–36.0)
MCV: 92.9 fL (ref 78.0–100.0)
MCV: 92.6 fL (ref 78.0–100.0)
Platelets: 247 10*3/uL (ref 150–400)
Platelets: 251 10*3/uL (ref 150–400)
RBC: 4.07 MIL/uL (ref 3.87–5.11)
RBC: 4.03 MIL/uL (ref 3.87–5.11)
RDW: 13.9 % (ref 11.5–15.5)
RDW: 14.1 % (ref 11.5–15.5)
WBC: 8.7 10*3/uL (ref 4.0–10.5)
WBC: 7.6 10*3/uL (ref 4.0–10.5)

## 2015-02-08 LAB — COMPREHENSIVE METABOLIC PANEL
ALT: 16 U/L (ref 14–54)
AST: 18 U/L (ref 15–41)
Albumin: 3.1 g/dL — ABNORMAL LOW (ref 3.5–5.0)
Alkaline Phosphatase: 82 U/L (ref 38–126)
Anion gap: 5 (ref 5–15)
BUN: 11 mg/dL (ref 6–20)
CO2: 31 mmol/L (ref 22–32)
Calcium: 8.6 mg/dL — ABNORMAL LOW (ref 8.9–10.3)
Chloride: 104 mmol/L (ref 101–111)
Creatinine, Ser: 0.98 mg/dL (ref 0.44–1.00)
GFR calc Af Amer: >60 mL/min (ref >60)
GFR calc non Af Amer: 56 mL/min — ABNORMAL LOW (ref >60)
Glucose, Bld: 107 mg/dL — ABNORMAL HIGH (ref 65–99)
Potassium: 3.9 mmol/L (ref 3.5–5.1)
Sodium: 140 mmol/L (ref 135–145)
Total Bilirubin: 0.5 mg/dL (ref 0.3–1.2)
Total Protein: 6 g/dL — ABNORMAL LOW (ref 6.5–8.1)

## 2015-02-08 LAB — PROTIME-INR
INR: 1 (ref 0.00–1.49)
Prothrombin Time: 13.4 seconds (ref 11.6–15.2)

## 2015-02-08 LAB — LIPID PANEL
Cholesterol: 207 mg/dL — ABNORMAL HIGH (ref 0–200)
HDL: 27 mg/dL — ABNORMAL LOW (ref >40)
LDL Cholesterol: 121 mg/dL — ABNORMAL HIGH (ref 0–99)
Total CHOL/HDL Ratio: 7.7 RATIO
Triglycerides: 297 mg/dL — ABNORMAL HIGH (ref <150)
VLDL: 59 mg/dL — ABNORMAL HIGH (ref 0–40)

## 2015-02-08 LAB — TROPONIN I
Troponin I: <0.03 ng/mL (ref <0.031)
Troponin I: <0.03 ng/mL (ref <0.031)

## 2015-02-08 LAB — HEMOGLOBIN A1C
Hgb A1c MFr Bld: 6 % — ABNORMAL HIGH (ref 4.8–5.6)
Mean Plasma Glucose: 126 mg/dL

## 2015-02-08 SURGERY — LEFT HEART CATH AND CORONARY ANGIOGRAPHY
Anesthesia: LOCAL

## 2015-02-08 MED ORDER — SODIUM CHLORIDE 0.9 % IV SOLN: INTRAVENOUS | Status: AC

## 2015-02-08 MED ORDER — MIDAZOLAM HCL 2 MG/2ML IJ SOLN
INTRAMUSCULAR | Status: AC
  Filled 2015-02-08: qty 4

## 2015-02-08 MED ORDER — SODIUM CHLORIDE 0.9 % IJ SOLN
3.0000 mL | Freq: Two times a day (BID) | INTRAMUSCULAR | Status: DC
  Administered 2015-02-08 – 2015-02-09 (×2): 3 mL via INTRAVENOUS

## 2015-02-08 MED ORDER — HEPARIN (PORCINE) IN NACL 2-0.9 UNIT/ML-% IJ SOLN
INTRAMUSCULAR | Status: AC
  Filled 2015-02-08: qty 1500

## 2015-02-08 MED ORDER — IOHEXOL 350 MG/ML SOLN
INTRAVENOUS | Status: DC | PRN
  Administered 2015-02-08: 100 mL via INTRAVENOUS

## 2015-02-08 MED ORDER — VERAPAMIL HCL 2.5 MG/ML IV SOLN
INTRAVENOUS | Status: DC | PRN
  Administered 2015-02-08: 16:00:00 via INTRA_ARTERIAL

## 2015-02-08 MED ORDER — HEPARIN (PORCINE) IN NACL 2-0.9 UNIT/ML-% IJ SOLN
INTRAMUSCULAR | Status: DC | PRN
  Administered 2015-02-08: 16:00:00

## 2015-02-08 MED ORDER — SODIUM CHLORIDE 0.9 % IV SOLN: 250.0000 mL | INTRAVENOUS | Status: DC | PRN

## 2015-02-08 MED ORDER — HEPARIN SODIUM (PORCINE) 1000 UNIT/ML IJ SOLN
INTRAMUSCULAR | Status: DC | PRN
  Administered 2015-02-08: 5000 [IU] via INTRAVENOUS

## 2015-02-08 MED ORDER — FENTANYL CITRATE (PF) 100 MCG/2ML IJ SOLN
INTRAMUSCULAR | Status: DC | PRN
  Administered 2015-02-08: 25 ug via INTRAVENOUS

## 2015-02-08 MED ORDER — LIDOCAINE HCL (PF) 1 % IJ SOLN
INTRAMUSCULAR | Status: DC | PRN
  Administered 2015-02-08 (×2): 2 mL

## 2015-02-08 MED ORDER — SODIUM CHLORIDE 0.9 % IJ SOLN: 3.0000 mL | INTRAMUSCULAR | Status: DC | PRN

## 2015-02-08 MED ORDER — HEPARIN SODIUM (PORCINE) 1000 UNIT/ML IJ SOLN
INTRAMUSCULAR | Status: AC
  Filled 2015-02-08: qty 1

## 2015-02-08 MED ORDER — FENTANYL CITRATE (PF) 100 MCG/2ML IJ SOLN
INTRAMUSCULAR | Status: AC
  Filled 2015-02-08: qty 4

## 2015-02-08 MED ORDER — MIDAZOLAM HCL 2 MG/2ML IJ SOLN
INTRAMUSCULAR | Status: DC | PRN
  Administered 2015-02-08: 1 mg via INTRAVENOUS

## 2015-02-08 MED ORDER — LIDOCAINE HCL (PF) 1 % IJ SOLN
INTRAMUSCULAR | Status: AC
  Filled 2015-02-08: qty 30

## 2015-02-08 SURGICAL SUPPLY — 12 items
CATH INFINITI 5 FR JL3.5 (CATHETERS) ×2 IMPLANT
CATH INFINITI 5FR ANG PIGTAIL (CATHETERS) ×2 IMPLANT
CATH INFINITI JR4 5F (CATHETERS) ×2 IMPLANT
DEVICE RAD COMP TR BAND LRG (VASCULAR PRODUCTS) ×2 IMPLANT
GLIDESHEATH SLEND SS 6F .021 (SHEATH) ×2 IMPLANT
KIT HEART LEFT (KITS) ×2 IMPLANT
PACK CARDIAC CATHETERIZATION (CUSTOM PROCEDURE TRAY) ×2 IMPLANT
SYR MEDRAD MARK V 150ML (SYRINGE) ×2 IMPLANT
TRANSDUCER W/STOPCOCK (MISCELLANEOUS) ×2 IMPLANT
TUBING CIL FLEX 10 FLL-RA (TUBING) ×2 IMPLANT
WIRE HITORQ VERSACORE ST 145CM (WIRE) ×2 IMPLANT
WIRE SAFE-T 1.5MM-J .035X260CM (WIRE) ×2 IMPLANT

## 2015-02-08 NOTE — Interval H&P Note (Signed)
History and Physical Interval Note:  02/08/2015 3:33 PM  [place]  has presented today for cardiac cath with the diagnosis of chest pain, unstable angina.  The various methods of treatment have been discussed with the patient and family. After consideration of risks, benefits and other options for treatment, the patient has consented to  Procedure(s): Left Heart Cath and Coronary Angiography (N/A) as a surgical intervention .  The patient's history has been reviewed, patient examined, no change in status, stable for surgery.  I have reviewed the patient's chart and labs.  Questions were answered to the patient's satisfaction.    Cath Lab Visit (complete for each Cath Lab visit)  Clinical Evaluation Leading to the Procedure:   ACS: No.  Non-ACS:    Anginal Classification: CCS II  Anti-ischemic medical therapy: No Therapy  Non-Invasive Test Results: No non-invasive testing performed  Prior CABG: No previous CABG        MCALHANY,CHRISTOPHER

## 2015-02-08 NOTE — H&P (View-Only) (Signed)
Patient Name: Cathleen Corti Date of Encounter: 02/08/2015   SUBJECTIVE  Feeling better. Denies chest pain, sob or palpitation.   CURRENT MEDS . aspirin EC  81 mg Oral Daily  . heparin  5,000 Units Subcutaneous 3 times per day  . Influenza vac split quadrivalent PF  0.5 mL Intramuscular Tomorrow-1000  . levothyroxine  175 mcg Oral QAC breakfast  . lisinopril  10 mg Oral Daily  . PARoxetine  20 mg Oral BID  . simvastatin  40 mg Oral QHS  . sodium chloride  3 mL Intravenous Q12H  . sodium chloride  3 mL Intravenous Q12H  . tiotropium  18 mcg Inhalation Daily    OBJECTIVE  Filed Vitals:   02/07/15 2029 02/07/15 2038 02/08/15 0529 02/08/15 0925  BP:  161/92 151/86   Pulse:  61 60   Temp:  98.1 F (36.7 C) 97.4 F (36.3 C)   TempSrc:  Oral Oral   Resp:      Height:  5' 7.5" (1.715 m)    Weight:  241 lb (109.317 kg)    SpO2: 100% 100% 97% 96%    Intake/Output Summary (Last 24 hours) at 02/08/15 1021 Last data filed at 02/07/15 2100  Gross per 24 hour  Intake    240 ml  Output      0 ml  Net    240 ml   Filed Weights   02/07/15 2038  Weight: 241 lb (109.317 kg)    PHYSICAL EXAM  General: Pleasant, NAD. Neuro: Alert and oriented X 3. Moves all extremities spontaneously. Psych: Normal affect. HEENT:  Normal  Neck: Supple without bruits or JVD. Lungs:  Resp regular and unlabored, CTA. Heart: RRR no s3, s4, or murmurs. Abdomen: Soft, non-tender, non-distended, BS + x 4.  Extremities: No clubbing, cyanosis or edema. DP/PT/Radials 2+ and equal bilaterally.  Accessory Clinical Findings  CBC  Recent Labs  02/07/15 2247 02/08/15 0249  WBC 8.7 7.6  HGB 12.7 12.4  HCT 37.8 37.3  MCV 92.9 92.6  PLT 247 825   Basic Metabolic Panel  Recent Labs  02/07/15 1519 02/07/15 2130 02/08/15 0249  NA 139  --  140  K 4.3  --  3.9  CL 105  --  104  CO2 25  --  31  GLUCOSE 100*  --  107*  BUN 9  --  11  CREATININE 0.89 0.93 0.98  CALCIUM 9.1  --  8.6*    Liver Function Tests  Recent Labs  02/08/15 0249  AST 18  ALT 16  ALKPHOS 82  BILITOT 0.5  PROT 6.0*  ALBUMIN 3.1*   No results for input(s): LIPASE, AMYLASE in the last 72 hours. Cardiac Enzymes  Recent Labs  02/07/15 2130 02/08/15 0249 02/08/15 0822  TROPONINI <0.03 <0.03 <0.03   BNP Invalid input(s): POCBNP D-Dimer No results for input(s): DDIMER in the last 72 hours. Hemoglobin A1C  Recent Labs  02/07/15 2130  HGBA1C 6.0*   Fasting Lipid Panel  Recent Labs  02/08/15 0249  CHOL 207*  HDL 27*  LDLCALC 121*  TRIG 297*  CHOLHDL 7.7   Thyroid Function Tests  Recent Labs  02/07/15 2139  TSH 25.813*    TELE  A paced. Occasionally AV paced.   Radiology/Studies  Dg Chest 2 View  02/07/2015   CLINICAL DATA:  Mid chest pain for 2 weeks with shortness of breath, history COPD, hypertension, GERD, former smoker  EXAM: CHEST  2 VIEW  COMPARISON:  04/22/2010  FINDINGS: RIGHT subclavian transvenous pacemaker leads project at RIGHT atrium and RIGHT ventricle.  Perigastric surgical clips LEFT upper quadrant.  Normal heart size, mediastinal contours and pulmonary vascularity.  Atherosclerotic calcification aorta.  Focal eventration RIGHT diaphragm stable.  Minimal central chronic peribronchial thickening.  No pulmonary infiltrate, pleural effusion or pneumothorax.  Linear subsegmental atelectasis at LEFT base.  No acute osseous findings.  IMPRESSION: Minimal LEFT basilar atelectasis.  No acute infiltrate.   Electronically Signed   By: Lavonia Dana M.D.   On: 02/07/2015 15:43    ASSESSMENT AND PLAN   JAILIN MANOCCHIO is a 74 y.o. female with a history of obesity, HTN, GERD, COPD, hypothyroidism, OSA and symptomatic bradycardia s/p PPM who presented with recurrent chest pain.  Chest pain -No further episode.  Troponin negative x3. Recent negative nuclear stress test (last week at [facility]), but ECG with new TW inversions in anterolat and inferior leads. Cath today.   - continue ASA, heparin [place], lisinopril and zocor - HgbA1c of 6.0  Palpitations/dizziness - - Palpitations improved.  - 02/07/15: Device programmed at appropriate safety margins. Histogram distribution appropriate for patient activity level. Device programmed to optimize intrinsic conduction. AT detection rate reprogrammed from 170bpm to 150bpm. Consecutive PVCs episode trigger programmed off, high V rate EGM max duration reprogrammed from 102min to 58min. PVAB reprogrammed from 138ms to 18ms, rate responsive PVARP reprogrammed off.  HL 02/08/2015: Cholesterol 207*; HDL 27*; LDL Cholesterol 121*; Triglycerides 297*; VLDL 59* - Continue Zocor 40mg   Signed, Bhagat,Bhavinkumar PA-C   I have seen and examined the patient along with Bhagat,Bhavinkumar PA-C.  I have reviewed the chart, notes and new data.  I agree with PA's note.  Key new complaints: no complaints at rest Key examination changes: no CHF, Apaced V sensed on momitor without arrhythmia Key new findings / data: low risk troponin  PLAN: Cardiac cath +/-PCI today for recurrent unexplained chest pressure. This procedure has been fully reviewed with the patient and written informed consent has been obtained.   Sanda Klein, MD, [place] [phone] 02/08/2015, 10:59 AM

## 2015-02-08 NOTE — Progress Notes (Signed)
Patient Name: Sheila Murray Date of Encounter: 02/08/2015   SUBJECTIVE  Feeling better. Denies chest pain, sob or palpitation.   CURRENT MEDS . aspirin EC  81 mg Oral Daily  . heparin  5,000 Units Subcutaneous 3 times per day  . Influenza vac split quadrivalent PF  0.5 mL Intramuscular Tomorrow-1000  . levothyroxine  175 mcg Oral QAC breakfast  . lisinopril  10 mg Oral Daily  . PARoxetine  20 mg Oral BID  . simvastatin  40 mg Oral QHS  . sodium chloride  3 mL Intravenous Q12H  . sodium chloride  3 mL Intravenous Q12H  . tiotropium  18 mcg Inhalation Daily    OBJECTIVE  Filed Vitals:   02/07/15 2029 02/07/15 2038 02/08/15 0529 02/08/15 0925  BP:  161/92 151/86   Pulse:  61 60   Temp:  98.1 F (36.7 C) 97.4 F (36.3 C)   TempSrc:  Oral Oral   Resp:      Height:  5' 7.5" (1.715 m)    Weight:  241 lb (109.317 kg)    SpO2: 100% 100% 97% 96%    Intake/Output Summary (Last 24 hours) at 02/08/15 1021 Last data filed at 02/07/15 2100  Gross per 24 hour  Intake    240 ml  Output      0 ml  Net    240 ml   Filed Weights   02/07/15 2038  Weight: 241 lb (109.317 kg)    PHYSICAL EXAM  General: Pleasant, NAD. Neuro: Alert and oriented X 3. Moves all extremities spontaneously. Psych: Normal affect. HEENT:  Normal  Neck: Supple without bruits or JVD. Lungs:  Resp regular and unlabored, CTA. Heart: RRR no s3, s4, or murmurs. Abdomen: Soft, non-tender, non-distended, BS + x 4.  Extremities: No clubbing, cyanosis or edema. DP/PT/Radials 2+ and equal bilaterally.  Accessory Clinical Findings  CBC  Recent Labs  02/07/15 2247 02/08/15 0249  WBC 8.7 7.6  HGB 12.7 12.4  HCT 37.8 37.3  MCV 92.9 92.6  PLT 247 270   Basic Metabolic Panel  Recent Labs  02/07/15 1519 02/07/15 2130 02/08/15 0249  NA 139  --  140  K 4.3  --  3.9  CL 105  --  104  CO2 25  --  31  GLUCOSE 100*  --  107*  BUN 9  --  11  CREATININE 0.89 0.93 0.98  CALCIUM 9.1  --  8.6*    Liver Function Tests  Recent Labs  02/08/15 0249  AST 18  ALT 16  ALKPHOS 82  BILITOT 0.5  PROT 6.0*  ALBUMIN 3.1*   No results for input(s): LIPASE, AMYLASE in the last 72 hours. Cardiac Enzymes  Recent Labs  02/07/15 2130 02/08/15 0249 02/08/15 0822  TROPONINI <0.03 <0.03 <0.03   BNP Invalid input(s): POCBNP D-Dimer No results for input(s): DDIMER in the last 72 hours. Hemoglobin A1C  Recent Labs  02/07/15 2130  HGBA1C 6.0*   Fasting Lipid Panel  Recent Labs  02/08/15 0249  CHOL 207*  HDL 27*  LDLCALC 121*  TRIG 297*  CHOLHDL 7.7   Thyroid Function Tests  Recent Labs  02/07/15 2139  TSH 25.813*    TELE  A paced. Occasionally AV paced.   Radiology/Studies  Dg Chest 2 View  02/07/2015   CLINICAL DATA:  Mid chest pain for 2 weeks with shortness of breath, history COPD, hypertension, GERD, former smoker  EXAM: CHEST  2 VIEW  COMPARISON:  04/22/2010  FINDINGS: RIGHT subclavian transvenous pacemaker leads project at RIGHT atrium and RIGHT ventricle.  Perigastric surgical clips LEFT upper quadrant.  Normal heart size, mediastinal contours and pulmonary vascularity.  Atherosclerotic calcification aorta.  Focal eventration RIGHT diaphragm stable.  Minimal central chronic peribronchial thickening.  No pulmonary infiltrate, pleural effusion or pneumothorax.  Linear subsegmental atelectasis at LEFT base.  No acute osseous findings.  IMPRESSION: Minimal LEFT basilar atelectasis.  No acute infiltrate.   Electronically Signed   By: Lavonia Dana M.D.   On: 02/07/2015 15:43    ASSESSMENT AND PLAN   Sheila Murray is a 74 y.o. female with a history of obesity, HTN, GERD, COPD, hypothyroidism, OSA and symptomatic bradycardia s/p PPM who presented with recurrent chest pain.  Chest pain -No further episode.  Troponin negative x3. Recent negative nuclear stress test (last week at [facility]), but ECG with new TW inversions in anterolat and inferior leads. Cath today.   - continue ASA, heparin [place], lisinopril and zocor - HgbA1c of 6.0  Palpitations/dizziness - - Palpitations improved.  - 02/07/15: Device programmed at appropriate safety margins. Histogram distribution appropriate for patient activity level. Device programmed to optimize intrinsic conduction. AT detection rate reprogrammed from 170bpm to 150bpm. Consecutive PVCs episode trigger programmed off, high V rate EGM max duration reprogrammed from 75min to 70min. PVAB reprogrammed from 173ms to 147ms, rate responsive PVARP reprogrammed off.  HL 02/08/2015: Cholesterol 207*; HDL 27*; LDL Cholesterol 121*; Triglycerides 297*; VLDL 59* - Continue Zocor 40mg   Signed, Bhagat,Bhavinkumar PA-C   I have seen and examined the patient along with Bhagat,Bhavinkumar PA-C.  I have reviewed the chart, notes and new data.  I agree with PA's note.  Key new complaints: no complaints at rest Key examination changes: no CHF, Apaced V sensed on momitor without arrhythmia Key new findings / data: low risk troponin  PLAN: Cardiac cath +/-PCI today for recurrent unexplained chest pressure. This procedure has been fully reviewed with the patient and written informed consent has been obtained.   Sanda Klein, MD, [place] [phone] 02/08/2015, 10:59 AM

## 2015-02-09 DIAGNOSIS — R0789 Other chest pain: Secondary | ICD-10-CM

## 2015-02-09 DIAGNOSIS — R002 Palpitations: Secondary | ICD-10-CM | POA: Diagnosis not present

## 2015-02-09 DIAGNOSIS — Z950 Presence of cardiac pacemaker: Secondary | ICD-10-CM | POA: Diagnosis not present

## 2015-02-09 MED ORDER — ASPIRIN 81 MG PO TBEC: 81.0000 mg | DELAYED_RELEASE_TABLET | Freq: Every day | ORAL | Status: DC

## 2015-02-09 NOTE — Progress Notes (Signed)
Pt discharged to home via W/C, condition stable, education and discharge instructions given, reports verbal understanding, accompanied by spouse. Edward Qualia RN

## 2015-02-09 NOTE — Progress Notes (Signed)
Patient Name: Sheila Murray Date of Encounter: 02/09/2015  Primary Cardiologist Dr. Lovena Le   Principal Problem:   Chest pain Active Problems:   Hypothyroidism   HYPERCHOLESTEROLEMIA   HTN (hypertension)   COPD (chronic obstructive pulmonary disease)   GASTROESOPHAGEAL REFLUX DISEASE   OSA (obstructive sleep apnea)   PACEMAKER, PERMANENT    SUBJECTIVE  Denies any CP or SOB.   CURRENT MEDS . aspirin EC  81 mg Oral Daily  . levothyroxine  175 mcg Oral QAC breakfast  . lisinopril  10 mg Oral Daily  . PARoxetine  20 mg Oral BID  . simvastatin  40 mg Oral QHS  . sodium chloride  3 mL Intravenous Q12H  . sodium chloride  3 mL Intravenous Q12H  . tiotropium  18 mcg Inhalation Daily    OBJECTIVE  Filed Vitals:   02/08/15 1720 02/08/15 2026 02/09/15 0431 02/09/15 0934  BP: 138/65 127/66 166/84   Pulse: 64 84 60   Temp:  98.4 F (36.9 C) 98.1 F (36.7 C)   TempSrc:  Oral Oral   Resp: 14 18    Height:      Weight:   238 lb 9.6 oz (108.228 kg)   SpO2: 98% 98% 96% 96%    Intake/Output Summary (Last 24 hours) at 02/09/15 1103 Last data filed at 02/09/15 0801  Gross per 24 hour  Intake    560 ml  Output      0 ml  Net    560 ml   [facility] Weights   02/07/15 2038 02/09/15 0431  Weight: 241 lb (109.317 kg) 238 lb 9.6 oz (108.228 kg)    PHYSICAL EXAM  General: Pleasant, NAD. Neuro: Alert and oriented X 3. Moves all extremities spontaneously. Psych: Normal affect. HEENT:  Normal  Neck: Supple without bruits or JVD. Lungs:  Resp regular and unlabored, CTA.  Heart: RRR no s3, s4, or murmurs. R radial cath site stable.  Abdomen: Soft, non-tender, non-distended, BS + x 4.  Extremities: No clubbing, cyanosis or edema. DP/PT/Radials 2+ and equal bilaterally.  Accessory Clinical Findings  CBC  Recent Labs  02/07/15 2247 02/08/15 0249  WBC 8.7 7.6  HGB 12.7 12.4  HCT 37.8 37.3  MCV 92.9 92.6  PLT 247 409   Basic Metabolic Panel  Recent Labs  02/07/15 1519 02/07/15 2130 02/08/15 0249  NA 139  --  140  K 4.3  --  3.9  CL 105  --  104  CO2 25  --  31  GLUCOSE 100*  --  107*  BUN 9  --  11  CREATININE 0.89 0.93 0.98  CALCIUM 9.1  --  8.6*   Liver Function Tests  Recent Labs  02/08/15 0249  AST 18  ALT 16  ALKPHOS 82  BILITOT 0.5  PROT 6.0*  ALBUMIN 3.1*   Cardiac Enzymes  Recent Labs  02/07/15 2130 02/08/15 0249 02/08/15 0822  TROPONINI <0.03 <0.03 <0.03   Hemoglobin A1C  Recent Labs  02/07/15 2130  HGBA1C 6.0*   Fasting Lipid Panel  Recent Labs  02/08/15 0249  CHOL 207*  HDL 27*  LDLCALC 121*  TRIG 297*  CHOLHDL 7.7   Thyroid Function Tests  Recent Labs  02/07/15 2139  TSH 25.813*    TELE Atrial and AV paced rhythm    ECG  No new EKG  Echocardiogram 09/01/2009  - Left ventricle: The cavity size was normal. There was mild focal  basal hypertrophy of the septum. Systolic function was  normal. The  estimated ejection fraction was in the range of 60% to 65%. Wall  motion was normal; there were no regional wall motion  abnormalities. Doppler parameters are consistent with abnormal  left ventricular relaxation (grade 1 diastolic dysfunction).  Doppler parameters are consistent with high ventricular filling  pressure. - Right ventricle: The cavity size was normal. Wall thickness was  increased. - Pulmonary arteries: Systolic pressure was mildly increased. PA  peak pressure: 56mm Hg (S).    Radiology/Studies  Dg Chest 2 View  02/07/2015   CLINICAL DATA:  Mid chest pain for 2 weeks with shortness of breath, history COPD, hypertension, GERD, former smoker  EXAM: CHEST  2 VIEW  COMPARISON:  04/22/2010  FINDINGS: RIGHT subclavian transvenous pacemaker leads project at RIGHT atrium and RIGHT ventricle.  Perigastric surgical clips LEFT upper quadrant.  Normal heart size, mediastinal contours and pulmonary vascularity.  Atherosclerotic calcification aorta.  Focal  eventration RIGHT diaphragm stable.  Minimal central chronic peribronchial thickening.  No pulmonary infiltrate, pleural effusion or pneumothorax.  Linear subsegmental atelectasis at LEFT base.  No acute osseous findings.  IMPRESSION: Minimal LEFT basilar atelectasis.  No acute infiltrate.   Electronically Signed   By: Lavonia Dana M.D.   On: 02/07/2015 15:43    ASSESSMENT AND PLAN  1. 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   - still has a little discomfort, states the chest discomfort has not gone away since arrival, CP caused by PPM? Will check with MD  2. Palpitation:   - interrogation onset ov atrial paced , ventricular paced rhythm, due to AV delay lengthening (or possibly atrial tachycardia at 100 bpm with PR prolongation and V pacing). It seems she perceives the V pacing as an unpleasant symptom. Only has 0.9% V pacing  - device reprogramed to allow longer AV delay. Still has tachycardic episode on telemetry with ?pacing spikes, reviewed with Dr. Bronson Ing, does not have BB or CCB  3. HTN 4. COPD 5. Hypothyroidism 6. OSA 7. Symptomatic bradycardia s/p [employer] PPM  8. Hyperlipidemia: Chol 207, trig 297, LDL 121, HDL 27. Continue statin  Signed, Almyra Deforest PA-C Pager: [phone]   Patient seen and examined. Agree with above. I have interogated the patient's PPM. She paces in the ventricle for unclear reasons despite increasing her AV delay. I have reprogrammed her to AAI pacing mode and had her walk in the halls. No heart block was noted. She paces 100% in the atria. We will allow her to go home. She will return to our device clinic in several weeks. No change in her meds. I spent over 30 minutes with the patient reprogramming her  device.  Mikle Bosworth.D.
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
## 2015-02-09 NOTE — Discharge Instructions (Signed)

## 2015-02-11 ENCOUNTER — Encounter (HOSPITAL_COMMUNITY): Payer: Self-pay | Admitting: Cardiovascular Disease

## 2015-02-11 ENCOUNTER — Telehealth: Payer: Self-pay | Admitting: Cardiovascular Disease

## 2015-02-11 NOTE — Telephone Encounter (Signed)
D/C phone call .Marland KitchenAppt on 03/14/15 at 11:45 w/ Dr. Lovena Le   Thanks

## 2015-02-11 NOTE — Telephone Encounter (Signed)
Patient contacted regarding discharge from [facility] on 02/09/15.  Patient understands to follow up with provider Dr Lovena Le on 03/14/15 at 11:45 at [facility] office. Patient understands discharge instructions? yes Patient understands medications and regiment? yes Patient understands to bring all medications to this visit? yes

## 2015-02-13 DIAGNOSIS — E039 Hypothyroidism, unspecified: Secondary | ICD-10-CM | POA: Diagnosis not present

## 2015-02-13 DIAGNOSIS — I2510 Atherosclerotic heart disease of native coronary artery without angina pectoris: Secondary | ICD-10-CM | POA: Diagnosis not present

## 2015-02-13 DIAGNOSIS — R079 Chest pain, unspecified: Secondary | ICD-10-CM | POA: Diagnosis not present

## 2015-02-13 DIAGNOSIS — E789 Disorder of lipoprotein metabolism, unspecified: Secondary | ICD-10-CM | POA: Diagnosis not present

## 2015-02-15 ENCOUNTER — Encounter: Payer: Self-pay | Admitting: Internal Medicine

## 2015-03-14 ENCOUNTER — Encounter: Payer: Self-pay | Admitting: Internal Medicine

## 2015-03-14 ENCOUNTER — Ambulatory Visit (INDEPENDENT_AMBULATORY_CARE_PROVIDER_SITE_OTHER): Payer: Medicare Other | Admitting: Internal Medicine

## 2015-03-14 VITALS — BP 130/72 | HR 60 | Ht 67.0 in | Wt 243.0 lb

## 2015-03-14 DIAGNOSIS — Z950 Presence of cardiac pacemaker: Secondary | ICD-10-CM | POA: Diagnosis not present

## 2015-03-14 DIAGNOSIS — I10 Essential (primary) hypertension: Secondary | ICD-10-CM | POA: Diagnosis not present

## 2015-03-14 DIAGNOSIS — R001 Bradycardia, unspecified: Secondary | ICD-10-CM

## 2015-03-14 LAB — CUP PACEART INCLINIC DEVICE CHECK
Battery Remaining Longevity: 70.8 mo
Battery Voltage: 2.89 V
Lead Channel Pacing Threshold Amplitude: 0.5 V
Lead Channel Pacing Threshold Pulse Width: 0.5 ms
Lead Channel Sensing Intrinsic Amplitude: 4.4 mV
MDC IDC MSMT LEADCHNL RA IMPEDANCE VALUE: 450 Ohm
MDC IDC MSMT LEADCHNL RA PACING THRESHOLD AMPLITUDE: 0.5 V
MDC IDC MSMT LEADCHNL RA PACING THRESHOLD PULSEWIDTH: 0.5 ms
MDC IDC PG SERIAL: 2296554
MDC IDC SESS DTM: 20160929122034
MDC IDC SET LEADCHNL RA PACING AMPLITUDE: 2 V
MDC IDC STAT BRADY RA PERCENT PACED: 90 %
MDC IDC STAT BRADY RV PERCENT PACED: 0 %

## 2015-03-14 NOTE — Progress Notes (Signed)
HPI Sheila Murray returns today for followup. She is a 74 yo woman with obesity, HTN, COPD, and symptomatic bradycardia, s/p PPM insertion. She has been stable in the interim.  Allergies  Allergen Reactions  . Codeine     REACTION: Nausea     Current Outpatient Prescriptions  Medication Sig Dispense Refill  . aspirin EC 81 MG EC tablet Take 1 tablet (81 mg total) by mouth daily.    . calcium-vitamin D (OSCAL WITH D) 500-200 MG-UNIT per tablet Take 1 tablet by mouth daily.      . fish oil-omega-3 fatty acids 1000 MG capsule Take 2 g by mouth daily.      Marland Kitchen levothyroxine (SYNTHROID, LEVOTHROID) 200 MCG tablet Take 200 mcg by mouth daily before breakfast.    . lisinopril (PRINIVIL,ZESTRIL) 10 MG tablet Take 10 mg by mouth daily.     Marland Kitchen LORazepam (ATIVAN) 0.5 MG tablet Take 0.5 mg by mouth as needed for sleep.     Marland Kitchen PARoxetine (PAXIL) 20 MG tablet Take 20 mg by mouth 2 (two) times daily.    . simvastatin (ZOCOR) 40 MG tablet Take 40 mg by mouth at bedtime.      Marland Kitchen tiotropium (SPIRIVA) 18 MCG inhalation capsule Place 18 mcg into inhaler and inhale daily.      . VENTOLIN HFA 108 (90 BASE) MCG/ACT inhaler Inhale 1-2 puffs into the lungs every 4 (four) hours as needed.      No current facility-administered medications for this visit.     Past Medical History  Diagnosis Date  . HTN (hypertension)   . GERD (gastroesophageal reflux disease)   . COPD (chronic obstructive pulmonary disease)   . Hypothyroidism   . Sleep apnea   . Diverticulosis of colon   . Other specified cardiac dysrhythmias(427.89)     ROS:   All systems reviewed and negative except as noted in the HPI.   Past Surgical History  Procedure Laterality Date  . Pacemaker insertion  [place]  . Stomach surgery    . Vaginal hysterectomy    . Eyelid surgery    . Cardiac catheterization N/A 02/08/2015    Procedure: Left Heart Cath and Coronary Angiography;  Surgeon: Burnell Blanks, MD;  Location: [facility];  Service: Cardiovascular;  Laterality: N/A;     Family History  Problem Relation Age of Onset  . Angina Mother   . Diabetes Mother   . Prostate cancer Father   . Healthy Brother   . Dementia Mother      Social History   Social History  . Marital Status: Married    Spouse Name: N/A  . Number of Children: N/A  . Years of Education: N/A   Occupational History  . Not on file.   Social History Main Topics  . Smoking status: Former Smoker    Types: Cigarettes    Quit date: 06/16/2011  . Smokeless tobacco: Never Used  . Alcohol Use: No  . Drug Use: Not on file  . Sexual Activity: Not on file   Other Topics Concern  . Not on file   Social History Narrative     BP 130/72 mmHg  Pulse 60  Ht 5\' 7"  (1.702 m)  Wt 243 lb (110.224 kg)  BMI 38.05 kg/m2  Physical Exam:  obese appearing 74 yo woman, NAD HEENT: Unremarkable Neck:  6 cm JVD, no thyromegally Lymphatics:  No adenopathy Back:  No CVA tenderness  Lungs:  Clear except for an occaisional scattered rale. HEART:  Regular rate rhythm, no murmurs, no rubs, no clicks Abd:  soft, positive bowel sounds, no organomegally, no rebound, no guarding Ext:  2 plus pulses, no edema, no cyanosis, no clubbing Skin:  No rashes no nodules Neuro:  CN II through XII intact, motor grossly intact   DEVICE  Normal device function.  See PaceArt for details.   Assess/Plan:

## 2015-03-14 NOTE — Assessment & Plan Note (Signed)
Her blood pressure is well controlled. No change in meds. 

## 2015-03-14 NOTE — Assessment & Plan Note (Signed)
I have strongly encouraged the patient to lose weight. She admits to drinking 2-3 sweet drinks a day. I have told her to stop this now.

## 2015-03-14 NOTE — Assessment & Plan Note (Signed)
Her device is programmed AAI and is working normally. Will follow.

## 2015-03-14 NOTE — Patient Instructions (Addendum)
Medication Instructions:  Your physician recommends that you continue on your current medications as directed. Please refer to the Current Medication list given to you today.   Labwork: None ordered   Testing/Procedures: None ordered   Follow-Up: Remote monitoring is used to monitor your Pacemaker from home. This monitoring reduces the number of office visits required to check your device to one time per year. It allows [place] to keep an eye on the functioning of your device to ensure it is working properly. You are scheduled for a device check from home on 06/13/15. You may send your transmission at any time that day. If you have a wireless device, the transmission will be sent automatically. After your physician reviews your transmission, you will receive a postcard with your next transmission date.     Your physician wants you to follow-up in: 12 months with Dr Knox Saliva will receive a reminder letter in the mail two months in advance. If you don't receive a letter, please call our office to schedule the follow-up appointment.   Any Other Special Instructions Will Be Listed Below (If Applicable).

## 2015-06-13 ENCOUNTER — Encounter: Payer: Medicare Other | Admitting: *Deleted

## 2015-06-13 ENCOUNTER — Telehealth: Payer: Self-pay | Admitting: Cardiology

## 2015-06-13 NOTE — Telephone Encounter (Signed)
LMOVM reminding pt to send remote transmission.   

## 2015-06-14 ENCOUNTER — Encounter: Payer: Self-pay | Admitting: Cardiology

## 2015-07-09 DIAGNOSIS — E032 Hypothyroidism due to medicaments and other exogenous substances: Secondary | ICD-10-CM | POA: Diagnosis not present

## 2015-07-09 DIAGNOSIS — E789 Disorder of lipoprotein metabolism, unspecified: Secondary | ICD-10-CM | POA: Diagnosis not present

## 2015-08-12 DIAGNOSIS — R05 Cough: Secondary | ICD-10-CM | POA: Diagnosis not present

## 2015-08-12 DIAGNOSIS — E032 Hypothyroidism due to medicaments and other exogenous substances: Secondary | ICD-10-CM | POA: Diagnosis not present

## 2015-08-13 DIAGNOSIS — I10 Essential (primary) hypertension: Secondary | ICD-10-CM | POA: Diagnosis not present

## 2015-08-13 DIAGNOSIS — R1110 Vomiting, unspecified: Secondary | ICD-10-CM | POA: Diagnosis not present

## 2015-08-13 DIAGNOSIS — J449 Chronic obstructive pulmonary disease, unspecified: Secondary | ICD-10-CM | POA: Diagnosis not present

## 2015-08-13 DIAGNOSIS — Z79899 Other long term (current) drug therapy: Secondary | ICD-10-CM | POA: Diagnosis not present

## 2015-08-13 DIAGNOSIS — E039 Hypothyroidism, unspecified: Secondary | ICD-10-CM | POA: Diagnosis not present

## 2015-08-13 DIAGNOSIS — F419 Anxiety disorder, unspecified: Secondary | ICD-10-CM | POA: Diagnosis not present

## 2015-08-13 DIAGNOSIS — R079 Chest pain, unspecified: Secondary | ICD-10-CM | POA: Diagnosis not present

## 2015-08-13 DIAGNOSIS — Z885 Allergy status to narcotic agent status: Secondary | ICD-10-CM | POA: Diagnosis not present

## 2015-08-13 DIAGNOSIS — F17211 Nicotine dependence, cigarettes, in remission: Secondary | ICD-10-CM | POA: Diagnosis not present

## 2015-08-13 DIAGNOSIS — Z950 Presence of cardiac pacemaker: Secondary | ICD-10-CM | POA: Diagnosis not present

## 2015-08-13 DIAGNOSIS — R197 Diarrhea, unspecified: Secondary | ICD-10-CM | POA: Diagnosis not present

## 2015-08-16 ENCOUNTER — Emergency Department (HOSPITAL_BASED_OUTPATIENT_CLINIC_OR_DEPARTMENT_OTHER)
Admission: EM | Admit: 2015-08-16 | Discharge: 2015-08-16 | Disposition: A | Discharge status: Home / Self Care | Payer: Medicare Other | Attending: Physician Assistant | Admitting: Physician Assistant

## 2015-08-16 ENCOUNTER — Encounter (HOSPITAL_BASED_OUTPATIENT_CLINIC_OR_DEPARTMENT_OTHER): Payer: Self-pay | Admitting: *Deleted

## 2015-08-16 ENCOUNTER — Emergency Department (HOSPITAL_BASED_OUTPATIENT_CLINIC_OR_DEPARTMENT_OTHER): Payer: Medicare Other

## 2015-08-16 ENCOUNTER — Telehealth: Payer: Self-pay | Admitting: Internal Medicine

## 2015-08-16 DIAGNOSIS — Z7982 Long term (current) use of aspirin: Secondary | ICD-10-CM | POA: Diagnosis not present

## 2015-08-16 DIAGNOSIS — R079 Chest pain, unspecified: Secondary | ICD-10-CM | POA: Diagnosis not present

## 2015-08-16 DIAGNOSIS — J441 Chronic obstructive pulmonary disease with (acute) exacerbation: Secondary | ICD-10-CM | POA: Diagnosis not present

## 2015-08-16 DIAGNOSIS — Z9889 Other specified postprocedural states: Secondary | ICD-10-CM | POA: Insufficient documentation

## 2015-08-16 DIAGNOSIS — Z950 Presence of cardiac pacemaker: Secondary | ICD-10-CM | POA: Diagnosis not present

## 2015-08-16 DIAGNOSIS — R05 Cough: Secondary | ICD-10-CM

## 2015-08-16 DIAGNOSIS — R110 Nausea: Secondary | ICD-10-CM | POA: Insufficient documentation

## 2015-08-16 DIAGNOSIS — Z79899 Other long term (current) drug therapy: Secondary | ICD-10-CM | POA: Insufficient documentation

## 2015-08-16 DIAGNOSIS — Z8669 Personal history of other diseases of the nervous system and sense organs: Secondary | ICD-10-CM | POA: Insufficient documentation

## 2015-08-16 DIAGNOSIS — R0602 Shortness of breath: Secondary | ICD-10-CM | POA: Diagnosis not present

## 2015-08-16 DIAGNOSIS — Z87891 Personal history of nicotine dependence: Secondary | ICD-10-CM | POA: Insufficient documentation

## 2015-08-16 DIAGNOSIS — Z8719 Personal history of other diseases of the digestive system: Secondary | ICD-10-CM | POA: Insufficient documentation

## 2015-08-16 DIAGNOSIS — R509 Fever, unspecified: Secondary | ICD-10-CM | POA: Diagnosis not present

## 2015-08-16 DIAGNOSIS — E039 Hypothyroidism, unspecified: Secondary | ICD-10-CM | POA: Diagnosis not present

## 2015-08-16 DIAGNOSIS — R059 Cough, unspecified: Secondary | ICD-10-CM

## 2015-08-16 DIAGNOSIS — I10 Essential (primary) hypertension: Secondary | ICD-10-CM | POA: Insufficient documentation

## 2015-08-16 LAB — CBC
HCT: 43.6 % (ref 36.0–46.0)
Hemoglobin: 15 g/dL (ref 12.0–15.0)
MCH: 30.9 pg (ref 26.0–34.0)
MCHC: 34.4 g/dL (ref 30.0–36.0)
MCV: 89.9 fL (ref 78.0–100.0)
PLATELETS: 300 10*3/uL (ref 150–400)
RBC: 4.85 MIL/uL (ref 3.87–5.11)
RDW: 13.6 % (ref 11.5–15.5)
WBC: 10.8 10*3/uL — AB (ref 4.0–10.5)

## 2015-08-16 LAB — BASIC METABOLIC PANEL
ANION GAP: 8 (ref 5–15)
BUN: 15 mg/dL (ref 6–20)
CALCIUM: 9 mg/dL (ref 8.9–10.3)
CO2: 26 mmol/L (ref 22–32)
CREATININE: 0.69 mg/dL (ref 0.44–1.00)
Chloride: 105 mmol/L (ref 101–111)
GLUCOSE: 100 mg/dL — AB (ref 65–99)
Potassium: 4.1 mmol/L (ref 3.5–5.1)
Sodium: 139 mmol/L (ref 135–145)

## 2015-08-16 LAB — TROPONIN I: Troponin I: <0.03 ng/mL (ref <0.031)

## 2015-08-16 MED ORDER — ONDANSETRON HCL 4 MG/2ML IJ SOLN
4.0000 mg | INTRAMUSCULAR | Status: AC
  Administered 2015-08-16: 4 mg via INTRAVENOUS
  Filled 2015-08-16: qty 2

## 2015-08-16 NOTE — Telephone Encounter (Signed)
Spoke with patient and she has been fighting a cough for weeks.  Was seen by her PCP and started on an antibiotic.  She says the cough is no better and she feels tight in her chest.  Looking back she had a cath in 01/2015 showing mild non-obstructive CAD and a normal LV.  I have asked that she call her PCP to get back in to be seen.  She was coughing while on the phone with me and she sounds tight.   She will do so and call back if needed

## 2015-08-16 NOTE — Discharge Instructions (Signed)
1. Medications: usual home medications 2. Treatment: rest, drink plenty of fluids  3. Follow Up: please followup with your primary doctor for discussion of your diagnoses and further evaluation after today's visit; if you do not have a primary care doctor use the resource guide provided to find one; please return to the ER for new or worsening symptoms   Cough, Adult A cough helps to clear your throat and lungs. A cough may last only 2-3 weeks (acute), or it may last longer than 8 weeks (chronic). Many different things can cause a cough. A cough may be a sign of an illness or another medical condition. HOME CARE  Pay attention to any changes in your cough.  Take medicines only as told by your doctor.  If you were prescribed an antibiotic medicine, take it as told by your doctor. Do not stop taking it even if you start to feel better.  Talk with your doctor before you try using a cough medicine.  Drink enough fluid to keep your pee (urine) clear or pale yellow.  If the air is dry, use a cold steam vaporizer or humidifier in your home.  Stay away from things that make you cough at work or at home.  If your cough is worse at night, try using extra pillows to raise your head up higher while you sleep.  Do not smoke, and try not to be around smoke. If you need help quitting, ask your doctor.  Do not have caffeine.  Do not drink alcohol.  Rest as needed. GET HELP IF:  You have new problems (symptoms).  You cough up yellow fluid (pus).  Your cough does not get better after 2-3 weeks, or your cough gets worse.  Medicine does not help your cough and you are not sleeping well.  You have pain that gets worse or pain that is not helped with medicine.  You have a fever.  You are losing weight and you do not know why.  You have night sweats. GET HELP RIGHT AWAY IF:  You cough up blood.  You have trouble breathing.  Your heartbeat is very fast.   This information is not  intended to replace advice given to you by your health care provider. Make sure you discuss any questions you have with your health care provider.   Document Released: 02/12/2011 Document Revised: 02/20/2015 Document Reviewed: 08/08/2014 Elsevier Interactive Patient Education [employer].

## 2015-08-16 NOTE — ED Notes (Signed)
Per pt report was seen in [facility] three days ago for cough and fever,n/v/d- started on antibiotics. Today felt different pressure on chest and SOB, dizziness and vomiting and nausea with diarrhea.

## 2015-08-16 NOTE — Telephone Encounter (Signed)
New Message:  Pt called in stating that she has been experiencing some light- headedness and chest pressure. Please f/u with her to advise her on what to do.

## 2015-08-16 NOTE — ED Notes (Signed)
PA at bedside.

## 2015-08-16 NOTE — ED Provider Notes (Signed)
CSN: [id number]     Arrival date & time 08/16/15  1221 History   First MD Initiated Contact with Patient 08/16/15 1235     Chief Complaint  Patient presents with  . Cough  . Chest Pain  . Emesis    HPI   Sheila Murray is a 75 y.o. female with a PMH of HTN, COPD, hypothyroidism, GERD who presents to the ED with productive cough and central chest pain. She states she was seen on Monday for cough and had an x-ray at that time. She notes she was started on an antibiotic, though does not remember which antibiotic. She reports she completed the course. She states she was seen in the emergency room later that night for nausea and vomiting, and was subsequently discharged (per record review, had a negative CXR at that time). She states she developed central chest pain last night. She reports her symptoms feel like someone is stepping on her chest. She reports coughing exacerbates her pain. She denies alleviating factors. She states her shortness of breath has improved since she stopped smoking. She reports subjective fever and chills. She reports nausea, though states her vomiting is resolved. She denies diarrhea, constipation, hematochezia, melena, numbness, weakness, paresthesia.   Past Medical History  Diagnosis Date  . HTN (hypertension)   . GERD (gastroesophageal reflux disease)   . COPD (chronic obstructive pulmonary disease) ([place])   . Hypothyroidism   . Sleep apnea   . Diverticulosis of colon   . Other specified cardiac dysrhythmias(427.89)    Past Surgical History  Procedure Laterality Date  . Pacemaker insertion  [place]  . Stomach surgery    . Vaginal hysterectomy    . Eyelid surgery    . Cardiac catheterization N/A 02/08/2015    Procedure: Left Heart Cath and Coronary Angiography;  Surgeon: Burnell Blanks, MD;  Location: [facility];  Service: Cardiovascular;  Laterality: N/A;   Family History  Problem Relation Age of Onset  . Angina Mother   .  Diabetes Mother   . Prostate cancer Father   . Healthy Brother   . Dementia Mother    Social History  Substance Use Topics  . Smoking status: Former Smoker    Types: Cigarettes    Quit date: 06/16/2011  . Smokeless tobacco: Never Used  . Alcohol Use: No   OB History    No data available      Review of Systems  Constitutional: Positive for fever and chills.  HENT: Positive for congestion.   Respiratory: Positive for cough and shortness of breath.   Cardiovascular: Positive for chest pain.  Gastrointestinal: Positive for nausea. Negative for vomiting, abdominal pain, diarrhea and constipation.  All other systems reviewed and are negative.     Allergies  Codeine  Home Medications   Prior to Admission medications   Medication Sig Start Date End Date Taking? Authorizing Provider  aspirin EC 81 MG EC tablet Take 1 tablet (81 mg total) by mouth daily. 02/09/15  Yes Brett Canales, PA-C  calcium-vitamin D (OSCAL WITH D) 500-200 MG-UNIT per tablet Take 1 tablet by mouth daily.     Yes Historical Provider, MD  fish oil-omega-3 fatty acids 1000 MG capsule Take 2 g by mouth daily.     Yes Historical Provider, MD  isometheptene-acetaminophen-dichloralphenazone (MIDRIN) 65-100-325 MG capsule Take 1 capsule by mouth 4 (four) times daily as needed for migraine. Maximum 5 capsules in 12 hours for migraine headaches, 8  capsules in 24 hours for tension headaches.   Yes Historical Provider, MD  levothyroxine (SYNTHROID, LEVOTHROID) 200 MCG tablet Take 200 mcg by mouth daily before breakfast.   Yes Historical Provider, MD  lisinopril (PRINIVIL,ZESTRIL) 10 MG tablet Take 10 mg by mouth daily.    Yes Historical Provider, MD  LORazepam (ATIVAN) 0.5 MG tablet Take 0.5 mg by mouth as needed for sleep.  06/11/14  Yes Historical Provider, MD  tiotropium (SPIRIVA) 18 MCG inhalation capsule Place 18 mcg into inhaler and inhale daily.     Yes Historical Provider, MD  VENTOLIN HFA 108 (90 BASE) MCG/ACT  inhaler Inhale 1-2 puffs into the lungs every 4 (four) hours as needed.  06/10/14  Yes Historical Provider, MD    BP 113/77 mmHg  Pulse 72  Temp(Src) 98.1 F (36.7 C) (Oral)  Resp 18  Ht 5\' 7"  (1.702 m)  Wt 107.502 kg  BMI 37.11 kg/m2  SpO2 98% Physical Exam  Constitutional: She is oriented to person, place, and time. She appears well-developed and well-nourished. No distress.  HENT:  Head: Normocephalic and atraumatic.  Right Ear: External ear normal.  Left Ear: External ear normal.  Nose: Nose normal.  Mouth/Throat: Uvula is midline, oropharynx is clear and moist and mucous membranes are normal.  Eyes: Conjunctivae, EOM and lids are normal. Pupils are equal, round, and reactive to light. Right eye exhibits no discharge. Left eye exhibits no discharge. No scleral icterus.  Neck: Normal range of motion. Neck supple.  Cardiovascular: Normal rate, regular rhythm, normal heart sounds, intact distal pulses and normal pulses.   Pulmonary/Chest: Effort normal and breath sounds normal. No respiratory distress. She has no wheezes. She has no rales. She exhibits no tenderness.  Abdominal: Soft. Normal appearance and bowel sounds are normal. She exhibits no distension and no mass. There is no tenderness. There is no rigidity, no rebound and no guarding.  Musculoskeletal: Normal range of motion. She exhibits no edema or tenderness.  Neurological: She is alert and oriented to person, place, and time. She has normal strength. No sensory deficit.  Skin: Skin is warm, dry and intact. No rash noted. She is not diaphoretic. No erythema. No pallor.  Psychiatric: She has a normal mood and affect. Her speech is normal and behavior is normal.  Nursing note and vitals reviewed.   ED Course  Procedures (including critical care time)  Labs Review Labs Reviewed  CBC - Abnormal; Notable for the following:    WBC 10.8 (*)    All other components within normal limits  BASIC METABOLIC PANEL - Abnormal;  Notable for the following:    Glucose, Bld 100 (*)    All other components within normal limits  TROPONIN I  TROPONIN I    Imaging Review Dg Chest 2 View  08/16/2015  CLINICAL DATA:  Cough and fever with nausea for 1 week EXAM: CHEST  2 VIEW COMPARISON:  August 12, 2015 and April 22, 2010 FINDINGS: There is no appreciable edema or consolidation. The heart size and pulmonary vascularity are normal. No adenopathy. Pacemaker leads are attached to the right atrium right ventricle. There is a stable small metallic foreign body in the region of the right atrium, unchanged. No adenopathy. There is postoperative change in the upper abdomen. IMPRESSION: No change in pacemaker lead positions. Small stable metallic foreign body in the right atrial region. No edema or consolidation. Electronically Signed   By: Lowella Grip III M.D.   On: 08/16/2015 13:00   I have  personally reviewed and evaluated these images and lab results as part of my medical decision-making.   EKG Interpretation   Date/Time:  Friday August 16 2015 12:45:06 EST Ventricular Rate:  58 PR Interval:  160 QRS Duration: 68 QT Interval:  401 QTC Calculation: 394 R Axis:   64 Text Interpretation:  Sinus rhythm Abnormal R-wave progression, early  transition No significant change since last tracing Confirmed by Gerald Leitz ([id number]) on 08/16/2015 12:50:16 PM      MDM   Final diagnoses:  Cough  Chest pain, unspecified chest pain type    75 year old female presents with chest pain and cough. Was evaluated by her PCP Monday and started on antibiotics. Was seen in the ED later that night for nausea and vomiting and subsequently discharged. She notes her chest pain started last night. She reports shortness of breath, which has actually improved from her baseline. Per record review, patient had a cardiac cath in August 2016, which revealed mild nonobstructive CAD and normal systolic LV function.  Patient is afebrile.  Hypertensive. Heart RRR. Lungs clear bilaterally. Abdomen soft, non-tender, non-distended.  EKG sinus rhythm, no significant change from prior, HR 58. CXR negative for edema or consolidation. Labs pending. Troponin negative. CBC remarkable for leukocytosis of 10.8, hemoglobin within normal limits. BMP unremarkable. Patient discussed with Dr. Thomasene Lot. Will obtain delta troponin.  On reassessment of patient, she denies chest pain, though notes mild nausea. Given zofran.  Delta troponin negative. Patient is nontoxic and well-appearing, feel she is stable for discharge at this time. Patient to follow-up with PCP. Strict return precautions discussed. Patient verbalizes her understanding and is in agreement with plan.  BP 113/77 mmHg  Pulse 72  Temp(Src) 98.1 F (36.7 C) (Oral)  Resp 18  Ht 5\' 7"  (1.702 m)  Wt 107.502 kg  BMI 37.11 kg/m2  SpO2 98%       Marella Chimes, PA-C 08/16/15 1628  Courteney Julio Alm, MD 08/19/15 1504

## 2015-08-20 DIAGNOSIS — I10 Essential (primary) hypertension: Secondary | ICD-10-CM | POA: Diagnosis not present

## 2015-08-20 DIAGNOSIS — T887XXA Unspecified adverse effect of drug or medicament, initial encounter: Secondary | ICD-10-CM | POA: Diagnosis not present

## 2015-08-20 DIAGNOSIS — E789 Disorder of lipoprotein metabolism, unspecified: Secondary | ICD-10-CM | POA: Diagnosis not present

## 2016-01-14 DIAGNOSIS — N390 Urinary tract infection, site not specified: Secondary | ICD-10-CM | POA: Diagnosis not present

## 2016-01-14 DIAGNOSIS — R109 Unspecified abdominal pain: Secondary | ICD-10-CM | POA: Diagnosis not present

## 2016-01-14 DIAGNOSIS — E032 Hypothyroidism due to medicaments and other exogenous substances: Secondary | ICD-10-CM | POA: Diagnosis not present

## 2016-01-22 DIAGNOSIS — K625 Hemorrhage of anus and rectum: Secondary | ICD-10-CM | POA: Diagnosis not present

## 2016-01-22 DIAGNOSIS — K600 Acute anal fissure: Secondary | ICD-10-CM | POA: Diagnosis not present

## 2016-01-22 DIAGNOSIS — R634 Abnormal weight loss: Secondary | ICD-10-CM | POA: Diagnosis not present

## 2016-02-10 ENCOUNTER — Other Ambulatory Visit: Payer: Self-pay

## 2016-05-06 DIAGNOSIS — Z23 Encounter for immunization: Secondary | ICD-10-CM | POA: Diagnosis not present

## 2016-08-12 DIAGNOSIS — K119 Disease of salivary gland, unspecified: Secondary | ICD-10-CM | POA: Diagnosis not present

## 2016-08-12 DIAGNOSIS — H900 Conductive hearing loss, bilateral: Secondary | ICD-10-CM | POA: Diagnosis not present

## 2016-08-12 DIAGNOSIS — H9313 Tinnitus, bilateral: Secondary | ICD-10-CM | POA: Diagnosis not present

## 2016-08-12 DIAGNOSIS — H938X3 Other specified disorders of ear, bilateral: Secondary | ICD-10-CM | POA: Diagnosis not present

## 2016-08-12 DIAGNOSIS — E039 Hypothyroidism, unspecified: Secondary | ICD-10-CM | POA: Diagnosis not present

## 2016-08-12 DIAGNOSIS — H9319 Tinnitus, unspecified ear: Secondary | ICD-10-CM | POA: Diagnosis not present

## 2016-08-12 DIAGNOSIS — H6123 Impacted cerumen, bilateral: Secondary | ICD-10-CM | POA: Diagnosis not present

## 2016-08-17 DIAGNOSIS — K119 Disease of salivary gland, unspecified: Secondary | ICD-10-CM | POA: Diagnosis not present

## 2016-09-15 DIAGNOSIS — R0602 Shortness of breath: Secondary | ICD-10-CM | POA: Diagnosis not present

## 2016-09-15 DIAGNOSIS — E032 Hypothyroidism due to medicaments and other exogenous substances: Secondary | ICD-10-CM | POA: Diagnosis not present

## 2016-09-15 DIAGNOSIS — E789 Disorder of lipoprotein metabolism, unspecified: Secondary | ICD-10-CM | POA: Diagnosis not present

## 2016-09-15 DIAGNOSIS — I10 Essential (primary) hypertension: Secondary | ICD-10-CM | POA: Diagnosis not present

## 2016-09-15 DIAGNOSIS — F439 Reaction to severe stress, unspecified: Secondary | ICD-10-CM | POA: Diagnosis not present

## 2016-09-15 DIAGNOSIS — R634 Abnormal weight loss: Secondary | ICD-10-CM | POA: Diagnosis not present

## 2016-10-22 DIAGNOSIS — F4320 Adjustment disorder, unspecified: Secondary | ICD-10-CM | POA: Diagnosis not present

## 2016-10-22 DIAGNOSIS — E032 Hypothyroidism due to medicaments and other exogenous substances: Secondary | ICD-10-CM | POA: Diagnosis not present

## 2016-10-22 DIAGNOSIS — L0390 Cellulitis, unspecified: Secondary | ICD-10-CM | POA: Diagnosis not present

## 2016-10-22 DIAGNOSIS — I10 Essential (primary) hypertension: Secondary | ICD-10-CM | POA: Diagnosis not present

## 2017-02-27 NOTE — Progress Notes (Deleted)
Cardiology Office Note Date:  02/27/2017  Patient ID:  Sheila Murray, Sheila Murray 01, 1942, MRN [id number] PCP:  Anda Kraft, MD  Cardiologist:  ***  ***refresh   Chief Complaint: ***  History of Present Illness: Sheila Murray is a 76 y.o. female with history of R parotid mass, following with Dr. Otis Brace, COPD, GERD, HTN, hypothyroidism, OSA, symptomatic bradycardia w/PPM.  She comes in today to be seen for Dr. Lovena Le, last seen by him in 2016   *** ?parotid mass *** CPAP? *** meds *** symptoms *** no remotes *** lipids/labs??  Device information: SJM dual chamber PPM, implanted 04/14/1999, gen change 01/21/09, RA lead 07/31/09, Dr. Lovena Le  Past Medical History:  Diagnosis Date  . COPD (chronic obstructive pulmonary disease) ([place])   . Diverticulosis of colon   . GERD (gastroesophageal reflux disease)   . HTN (hypertension)   . Hypothyroidism   . Other specified cardiac dysrhythmias(427.89)   . Sleep apnea     Past Surgical History:  Procedure Laterality Date  . CARDIAC CATHETERIZATION N/A 02/08/2015   Procedure: Left Heart Cath and Coronary Angiography;  Surgeon: Burnell Blanks, MD;  Location: [facility];  Service: Cardiovascular;  Laterality: N/A;  . eyelid surgery    . PACEMAKER INSERTION  2000   [employer]  . STOMACH SURGERY    . VAGINAL HYSTERECTOMY      Current Outpatient Prescriptions  Medication Sig Dispense Refill  . aspirin EC 81 MG EC tablet Take 1 tablet (81 mg total) by mouth daily.    . calcium-vitamin D (OSCAL WITH D) 500-200 MG-UNIT per tablet Take 1 tablet by mouth daily.      . fish oil-omega-3 fatty acids 1000 MG capsule Take 2 g by mouth daily.      Marland Kitchen isometheptene-acetaminophen-dichloralphenazone (MIDRIN) 65-100-325 MG capsule Take 1 capsule by mouth 4 (four) times daily as needed for migraine. Maximum 5 capsules in 12 hours for migraine headaches, 8 capsules in 24 hours for tension headaches.    . levothyroxine (SYNTHROID,  LEVOTHROID) 200 MCG tablet Take 200 mcg by mouth daily before breakfast.    . lisinopril (PRINIVIL,ZESTRIL) 10 MG tablet Take 10 mg by mouth daily.     Marland Kitchen LORazepam (ATIVAN) 0.5 MG tablet Take 0.5 mg by mouth as needed for sleep.     Marland Kitchen tiotropium (SPIRIVA) 18 MCG inhalation capsule Place 18 mcg into inhaler and inhale daily.      . VENTOLIN HFA 108 (90 BASE) MCG/ACT inhaler Inhale 1-2 puffs into the lungs every 4 (four) hours as needed.      No current facility-administered medications for this visit.     Allergies:   Codeine   Social History:  The patient  reports that she quit smoking about 5 years ago. Her smoking use included Cigarettes. She has never used smokeless tobacco. She reports that she does not drink alcohol.   Family History:  The patient's family history includes Angina in her mother; Dementia in her mother; Diabetes in her mother; Healthy in her brother; Prostate cancer in her father.  ROS:  Please see the history of present illness.    All other systems are reviewed and otherwise negative.   PHYSICAL EXAM: *** VS:  There were no vitals taken for this visit. BMI: There is no height or weight on file to calculate BMI. Well nourished, well developed, in no acute distress  HEENT: normocephalic, atraumatic  Neck: no JVD, carotid bruits or masses Cardiac:  ***RRR; no significant  murmurs, no rubs, or gallops Lungs:  *** CTA b/l, no wheezing, rhonchi or rales  Abd: soft, nontender MS: no deformity or *** atrophy Ext:***  no edema  Skin: warm and dry, no rash Neuro:  No gross deficits appreciated Psych: euthymic mood, full affect  *** PPM site is stable, no tethering or discomfort  EKG:  Done today shows *** PPM interrogation done today and reviewed by myself: ***   02/08/15: LHC  Prox RCA lesion, 20% stenosed.  Mid RCA lesion, 20% stenosed.  2nd Mrg lesion, 40% stenosed.  Mid LAD lesion, 20% stenosed.  The left ventricular systolic function is normal. 1. Mild  non-obstructive CAD 2. Normal LV systolic function Recommendations: Medical management of CAD.   09/11/09: TTE Study Conclusions - Left ventricle: The cavity size was normal. There was mild focal  basal hypertrophy of the septum. Systolic function was normal. The  estimated ejection fraction was in the range of 60% to 65%. Wall  motion was normal; there were no regional wall motion  abnormalities. Doppler parameters are consistent with abnormal  left ventricular relaxation (grade 1 diastolic dysfunction).  Doppler parameters are consistent with high ventricular filling  pressure. - Right ventricle: The cavity size was normal. Wall thickness was  increased. - Pulmonary arteries: Systolic pressure was mildly increased. PA  peak pressure: 25mm Hg (S).  Recent Labs: No results found for requested labs within last 8760 hours.  No results found for requested labs within last 8760 hours.   CrCl cannot be calculated (Patient's most recent lab result is older than the maximum 21 days allowed.).   Wt Readings from Last 3 Encounters:  08/16/15 237 lb (107.5 kg)  03/14/15 243 lb (110.2 kg)  02/09/15 238 lb 9.6 oz (108.2 kg)     Other studies reviewed: Additional studies/records reviewed today include: summarized above  ASSESSMENT AND PLAN:  1. PPM     ***  2. HTN     ***  Disposition: F/u with ***  Current medicines are reviewed at length with the patient today.  The patient did not have any concerns regarding medicines.***  Signed, Tommye Standard, PA-C 02/27/2017 1:57 PM     [place] [place] [address] [phone] (office)  [phone] (fax)

## 2017-03-02 ENCOUNTER — Encounter: Payer: Medicare Other | Admitting: Physician Assistant

## 2017-03-09 DIAGNOSIS — D37030 Neoplasm of uncertain behavior of the parotid salivary glands: Secondary | ICD-10-CM | POA: Diagnosis not present

## 2017-03-09 DIAGNOSIS — K119 Disease of salivary gland, unspecified: Secondary | ICD-10-CM | POA: Diagnosis not present

## 2017-03-15 NOTE — Progress Notes (Deleted)
Cardiology Office Note Date:  03/15/2017  Patient ID:  Sheila Murray, Sheila Murray 1940-08-28, MRN [id number] PCP:  Anda Kraft, MD  Cardiologist:  ***  ***refresh   Chief Complaint: ***  History of Present Illness: Sheila Murray is a 76 y.o. female with history of R parotid mass, following with Dr. Otis Brace, COPD, GERD, HTN, hypothyroidism, OSA, symptomatic bradycardia w/PPM.  She comes in today to be seen for Dr. Lovena Le, last seen by him in 2016   *** ?parotid mass *** CPAP? *** meds *** symptoms *** no remotes *** lipids/labs??  Device information: SJM dual chamber PPM, implanted 04/14/1999, gen change 01/21/09, RA lead 07/31/09, Dr. Lovena Le  Past Medical History:  Diagnosis Date  . COPD (chronic obstructive pulmonary disease) ([place])   . Diverticulosis of colon   . GERD (gastroesophageal reflux disease)   . HTN (hypertension)   . Hypothyroidism   . Other specified cardiac dysrhythmias(427.89)   . Sleep apnea     Past Surgical History:  Procedure Laterality Date  . CARDIAC CATHETERIZATION N/A 02/08/2015   Procedure: Left Heart Cath and Coronary Angiography;  Surgeon: Burnell Blanks, MD;  Location: [facility];  Service: Cardiovascular;  Laterality: N/A;  . eyelid surgery    . PACEMAKER INSERTION  2000   [employer]  . STOMACH SURGERY    . VAGINAL HYSTERECTOMY      Current Outpatient Prescriptions  Medication Sig Dispense Refill  . aspirin EC 81 MG EC tablet Take 1 tablet (81 mg total) by mouth daily.    . calcium-vitamin D (OSCAL WITH D) 500-200 MG-UNIT per tablet Take 1 tablet by mouth daily.      . fish oil-omega-3 fatty acids 1000 MG capsule Take 2 g by mouth daily.      Marland Kitchen isometheptene-acetaminophen-dichloralphenazone (MIDRIN) 65-100-325 MG capsule Take 1 capsule by mouth 4 (four) times daily as needed for migraine. Maximum 5 capsules in 12 hours for migraine headaches, 8 capsules in 24 hours for tension headaches.    . levothyroxine (SYNTHROID,  LEVOTHROID) 200 MCG tablet Take 200 mcg by mouth daily before breakfast.    . lisinopril (PRINIVIL,ZESTRIL) 10 MG tablet Take 10 mg by mouth daily.     Marland Kitchen LORazepam (ATIVAN) 0.5 MG tablet Take 0.5 mg by mouth as needed for sleep.     Marland Kitchen tiotropium (SPIRIVA) 18 MCG inhalation capsule Place 18 mcg into inhaler and inhale daily.      . VENTOLIN HFA 108 (90 BASE) MCG/ACT inhaler Inhale 1-2 puffs into the lungs every 4 (four) hours as needed.      No current facility-administered medications for this visit.     Allergies:   Codeine   Social History:  The patient  reports that she quit smoking about 5 years ago. Her smoking use included Cigarettes. She has never used smokeless tobacco. She reports that she does not drink alcohol.   Family History:  The patient's family history includes Angina in her mother; Dementia in her mother; Diabetes in her mother; Healthy in her brother; Prostate cancer in her father.  ROS:  Please see the history of present illness.    All other systems are reviewed and otherwise negative.   PHYSICAL EXAM: *** VS:  There were no vitals taken for this visit. BMI: There is no height or weight on file to calculate BMI. Well nourished, well developed, in no acute distress  HEENT: normocephalic, atraumatic  Neck: no JVD, carotid bruits or masses Cardiac:  ***RRR; no significant  murmurs, no rubs, or gallops Lungs:  *** CTA b/l, no wheezing, rhonchi or rales  Abd: soft, nontender MS: no deformity or *** atrophy Ext:***  no edema  Skin: warm and dry, no rash Neuro:  No gross deficits appreciated Psych: euthymic mood, full affect  *** PPM site is stable, no tethering or discomfort  EKG:  Done today shows *** PPM interrogation done today and reviewed by myself: ***   02/08/15: LHC  Prox RCA lesion, 20% stenosed.  Mid RCA lesion, 20% stenosed.  2nd Mrg lesion, 40% stenosed.  Mid LAD lesion, 20% stenosed.  The left ventricular systolic function is normal. 1. Mild  non-obstructive CAD 2. Normal LV systolic function Recommendations: Medical management of CAD.   09/11/09: TTE Study Conclusions - Left ventricle: The cavity size was normal. There was mild focal  basal hypertrophy of the septum. Systolic function was normal. The  estimated ejection fraction was in the range of 60% to 65%. Wall  motion was normal; there were no regional wall motion  abnormalities. Doppler parameters are consistent with abnormal  left ventricular relaxation (grade 1 diastolic dysfunction).  Doppler parameters are consistent with high ventricular filling  pressure. - Right ventricle: The cavity size was normal. Wall thickness was  increased. - Pulmonary arteries: Systolic pressure was mildly increased. PA  peak pressure: 21mm Hg (S).  Recent Labs: No results found for requested labs within last 8760 hours.  No results found for requested labs within last 8760 hours.   CrCl cannot be calculated (Patient's most recent lab result is older than the maximum 21 days allowed.).   Wt Readings from Last 3 Encounters:  08/16/15 237 lb (107.5 kg)  03/14/15 243 lb (110.2 kg)  02/09/15 238 lb 9.6 oz (108.2 kg)     Other studies reviewed: Additional studies/records reviewed today include: summarized above  ASSESSMENT AND PLAN:  1. PPM     ***  2. HTN     ***  Disposition: F/u with ***  Current medicines are reviewed at length with the patient today.  The patient did not have any concerns regarding medicines.***  Venetia Night, PA-C 03/15/2017 7:43 AM     [facility] [place] [address] [place] [address] [phone] (office)  [phone] (fax)

## 2017-03-16 ENCOUNTER — Encounter: Payer: Medicare Other | Admitting: Physician Assistant

## 2017-03-25 DIAGNOSIS — K119 Disease of salivary gland, unspecified: Secondary | ICD-10-CM | POA: Diagnosis not present

## 2017-03-26 DIAGNOSIS — K119 Disease of salivary gland, unspecified: Secondary | ICD-10-CM | POA: Diagnosis not present

## 2017-03-30 DIAGNOSIS — Z720 Tobacco use: Secondary | ICD-10-CM | POA: Diagnosis not present

## 2017-03-30 DIAGNOSIS — F329 Major depressive disorder, single episode, unspecified: Secondary | ICD-10-CM | POA: Diagnosis not present

## 2017-03-30 DIAGNOSIS — I10 Essential (primary) hypertension: Secondary | ICD-10-CM | POA: Diagnosis not present

## 2017-03-30 DIAGNOSIS — Z23 Encounter for immunization: Secondary | ICD-10-CM | POA: Diagnosis not present

## 2017-03-30 DIAGNOSIS — D110 Benign neoplasm of parotid gland: Secondary | ICD-10-CM | POA: Diagnosis not present

## 2017-03-30 DIAGNOSIS — E039 Hypothyroidism, unspecified: Secondary | ICD-10-CM | POA: Diagnosis not present

## 2017-03-30 DIAGNOSIS — K119 Disease of salivary gland, unspecified: Secondary | ICD-10-CM | POA: Diagnosis not present

## 2017-03-30 DIAGNOSIS — E7800 Pure hypercholesterolemia, unspecified: Secondary | ICD-10-CM | POA: Diagnosis not present

## 2017-03-30 DIAGNOSIS — G4733 Obstructive sleep apnea (adult) (pediatric): Secondary | ICD-10-CM | POA: Diagnosis not present

## 2017-04-01 DIAGNOSIS — I10 Essential (primary) hypertension: Secondary | ICD-10-CM | POA: Diagnosis not present

## 2017-04-01 DIAGNOSIS — E039 Hypothyroidism, unspecified: Secondary | ICD-10-CM | POA: Diagnosis not present

## 2017-04-01 DIAGNOSIS — E7800 Pure hypercholesterolemia, unspecified: Secondary | ICD-10-CM | POA: Diagnosis not present

## 2017-04-02 DIAGNOSIS — D110 Benign neoplasm of parotid gland: Secondary | ICD-10-CM | POA: Diagnosis not present

## 2017-04-06 DIAGNOSIS — D110 Benign neoplasm of parotid gland: Secondary | ICD-10-CM | POA: Diagnosis not present

## 2017-04-06 DIAGNOSIS — Z90710 Acquired absence of both cervix and uterus: Secondary | ICD-10-CM | POA: Diagnosis not present

## 2017-04-06 DIAGNOSIS — F17210 Nicotine dependence, cigarettes, uncomplicated: Secondary | ICD-10-CM | POA: Diagnosis not present

## 2017-04-06 DIAGNOSIS — F329 Major depressive disorder, single episode, unspecified: Secondary | ICD-10-CM | POA: Diagnosis not present

## 2017-04-06 DIAGNOSIS — E785 Hyperlipidemia, unspecified: Secondary | ICD-10-CM | POA: Diagnosis not present

## 2017-04-06 DIAGNOSIS — Z885 Allergy status to narcotic agent status: Secondary | ICD-10-CM | POA: Diagnosis not present

## 2017-04-06 DIAGNOSIS — F419 Anxiety disorder, unspecified: Secondary | ICD-10-CM | POA: Diagnosis not present

## 2017-04-06 DIAGNOSIS — I10 Essential (primary) hypertension: Secondary | ICD-10-CM | POA: Diagnosis not present

## 2017-04-06 DIAGNOSIS — Z7982 Long term (current) use of aspirin: Secondary | ICD-10-CM | POA: Diagnosis not present

## 2017-04-06 DIAGNOSIS — E039 Hypothyroidism, unspecified: Secondary | ICD-10-CM | POA: Diagnosis not present

## 2017-04-06 DIAGNOSIS — Z950 Presence of cardiac pacemaker: Secondary | ICD-10-CM | POA: Diagnosis not present

## 2017-04-06 DIAGNOSIS — K118 Other diseases of salivary glands: Secondary | ICD-10-CM | POA: Diagnosis not present

## 2017-04-06 DIAGNOSIS — Z79899 Other long term (current) drug therapy: Secondary | ICD-10-CM | POA: Diagnosis not present

## 2017-04-06 DIAGNOSIS — I4589 Other specified conduction disorders: Secondary | ICD-10-CM | POA: Diagnosis not present

## 2017-04-06 DIAGNOSIS — J449 Chronic obstructive pulmonary disease, unspecified: Secondary | ICD-10-CM | POA: Diagnosis not present

## 2017-04-12 DIAGNOSIS — J449 Chronic obstructive pulmonary disease, unspecified: Secondary | ICD-10-CM | POA: Diagnosis present

## 2017-04-12 DIAGNOSIS — Z7982 Long term (current) use of aspirin: Secondary | ICD-10-CM | POA: Diagnosis not present

## 2017-04-12 DIAGNOSIS — Z90710 Acquired absence of both cervix and uterus: Secondary | ICD-10-CM | POA: Diagnosis not present

## 2017-04-12 DIAGNOSIS — D110 Benign neoplasm of parotid gland: Secondary | ICD-10-CM | POA: Diagnosis not present

## 2017-04-12 DIAGNOSIS — I10 Essential (primary) hypertension: Secondary | ICD-10-CM | POA: Diagnosis present

## 2017-04-12 DIAGNOSIS — E039 Hypothyroidism, unspecified: Secondary | ICD-10-CM | POA: Diagnosis present

## 2017-04-12 DIAGNOSIS — F329 Major depressive disorder, single episode, unspecified: Secondary | ICD-10-CM | POA: Diagnosis present

## 2017-04-12 DIAGNOSIS — K118 Other diseases of salivary glands: Secondary | ICD-10-CM | POA: Diagnosis present

## 2017-04-12 DIAGNOSIS — Z885 Allergy status to narcotic agent status: Secondary | ICD-10-CM | POA: Diagnosis not present

## 2017-04-12 DIAGNOSIS — E785 Hyperlipidemia, unspecified: Secondary | ICD-10-CM | POA: Diagnosis present

## 2017-04-12 DIAGNOSIS — Z79899 Other long term (current) drug therapy: Secondary | ICD-10-CM | POA: Diagnosis not present

## 2017-04-12 DIAGNOSIS — F17210 Nicotine dependence, cigarettes, uncomplicated: Secondary | ICD-10-CM | POA: Diagnosis present

## 2017-04-12 DIAGNOSIS — Z950 Presence of cardiac pacemaker: Secondary | ICD-10-CM | POA: Diagnosis not present

## 2017-04-12 DIAGNOSIS — F419 Anxiety disorder, unspecified: Secondary | ICD-10-CM | POA: Diagnosis present

## 2017-04-14 ENCOUNTER — Telehealth: Payer: Self-pay | Admitting: Internal Medicine

## 2017-04-14 NOTE — Telephone Encounter (Signed)
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

## 2017-04-14 NOTE — Telephone Encounter (Signed)
Per pt [facility] will be sending this weeks paper work soon.

## 2017-04-15 ENCOUNTER — Encounter: Payer: Medicare Other | Admitting: Physician Assistant

## 2017-04-20 DIAGNOSIS — M25511 Pain in right shoulder: Secondary | ICD-10-CM | POA: Diagnosis not present

## 2017-04-20 DIAGNOSIS — F418 Other specified anxiety disorders: Secondary | ICD-10-CM | POA: Diagnosis not present

## 2017-04-21 DIAGNOSIS — M25511 Pain in right shoulder: Secondary | ICD-10-CM | POA: Diagnosis not present

## 2017-04-27 DIAGNOSIS — D110 Benign neoplasm of parotid gland: Secondary | ICD-10-CM | POA: Diagnosis not present

## 2017-04-27 DIAGNOSIS — Z483 Aftercare following surgery for neoplasm: Secondary | ICD-10-CM | POA: Diagnosis not present

## 2017-04-30 DIAGNOSIS — G8929 Other chronic pain: Secondary | ICD-10-CM | POA: Diagnosis not present

## 2017-04-30 DIAGNOSIS — M19011 Primary osteoarthritis, right shoulder: Secondary | ICD-10-CM | POA: Diagnosis not present

## 2017-04-30 DIAGNOSIS — I10 Essential (primary) hypertension: Secondary | ICD-10-CM | POA: Diagnosis not present

## 2017-04-30 DIAGNOSIS — M25511 Pain in right shoulder: Secondary | ICD-10-CM | POA: Diagnosis not present

## 2017-05-20 DIAGNOSIS — M25511 Pain in right shoulder: Secondary | ICD-10-CM | POA: Diagnosis not present

## 2017-05-20 DIAGNOSIS — F418 Other specified anxiety disorders: Secondary | ICD-10-CM | POA: Diagnosis not present

## 2017-05-31 ENCOUNTER — Telehealth: Payer: Self-pay | Admitting: Internal Medicine

## 2017-05-31 NOTE — Telephone Encounter (Signed)
New Message  Pt call requesting to speak with RN to f/u on information faxed to Dr. Tanna Furry office from [place]. Please call back to discuss

## 2017-06-02 NOTE — Telephone Encounter (Signed)
Returned call to Pt.  Per Pt she is being worked up for shoulder pain and her orthopedic doctor needs information regarding her PPM.  Pt may need MRI and surgeon needs PPM specifics.  It does not appear any fax has been received from orthopedics requesting this info.  Call placed to [facility] [phone].  Left [facility] fax # to send request for info.  Left this nurse name and # if any further info needed.  Will await request.

## 2017-06-09 NOTE — Telephone Encounter (Signed)
Follow Up Call:        Sheila Murray is calling about to see if a clearance was sent to Dr. Andree Elk office

## 2017-06-09 NOTE — Telephone Encounter (Signed)
Follow Up:    Checking to see if the clearance have been sent to Dr Andree Elk in [place]?

## 2017-06-10 NOTE — Telephone Encounter (Signed)
Returned call to Pt.  Notified Pt I had spoken with Dr. Andree Elk office and they had agreed to fax request to this office.  Fax received yesterday evening with required information.  Notified Pt office would complete and fax back to Dr. Andree Elk today.  Pt indicates understanding.

## 2017-06-11 DIAGNOSIS — M25511 Pain in right shoulder: Secondary | ICD-10-CM | POA: Diagnosis not present

## 2017-06-11 DIAGNOSIS — G8929 Other chronic pain: Secondary | ICD-10-CM | POA: Diagnosis not present

## 2017-06-11 DIAGNOSIS — F4542 Pain disorder with related psychological factors: Secondary | ICD-10-CM | POA: Diagnosis not present

## 2017-06-11 DIAGNOSIS — Z950 Presence of cardiac pacemaker: Secondary | ICD-10-CM | POA: Diagnosis not present

## 2017-06-11 DIAGNOSIS — M19011 Primary osteoarthritis, right shoulder: Secondary | ICD-10-CM | POA: Diagnosis not present

## 2017-06-11 DIAGNOSIS — M25411 Effusion, right shoulder: Secondary | ICD-10-CM | POA: Diagnosis not present

## 2017-06-22 ENCOUNTER — Telehealth: Payer: Self-pay | Admitting: Internal Medicine

## 2017-06-22 ENCOUNTER — Encounter: Payer: Self-pay | Admitting: Internal Medicine

## 2017-06-22 ENCOUNTER — Ambulatory Visit (INDEPENDENT_AMBULATORY_CARE_PROVIDER_SITE_OTHER): Payer: Medicare HMO | Admitting: Internal Medicine

## 2017-06-22 VITALS — HR 62 | Ht 67.0 in | Wt 189.0 lb

## 2017-06-22 DIAGNOSIS — Z950 Presence of cardiac pacemaker: Secondary | ICD-10-CM | POA: Diagnosis not present

## 2017-06-22 DIAGNOSIS — I10 Essential (primary) hypertension: Secondary | ICD-10-CM | POA: Diagnosis not present

## 2017-06-22 DIAGNOSIS — R001 Bradycardia, unspecified: Secondary | ICD-10-CM | POA: Diagnosis not present

## 2017-06-22 LAB — CUP PACEART INCLINIC DEVICE CHECK
Battery Remaining Longevity: 20 mo
Battery Voltage: 2.75 V
Brady Statistic RV Percent Paced: 0 %
Date Time Interrogation Session: 20190108112134
Implantable Lead Implant Date: 20001030
Implantable Lead Location: 753859
Implantable Pulse Generator Implant Date: 20100809
Lead Channel Impedance Value: 437.5 Ohm
Lead Channel Impedance Value: 612.5 Ohm
Lead Channel Pacing Threshold Amplitude: 0.75 V
Lead Channel Pacing Threshold Pulse Width: 0.5 ms
Lead Channel Setting Pacing Amplitude: 2 V
Lead Channel Setting Pacing Amplitude: 2.5 V
MDC IDC LEAD IMPLANT DT: 20110216
MDC IDC LEAD LOCATION: 753860
MDC IDC MSMT LEADCHNL RA SENSING INTR AMPL: 3.5 mV
MDC IDC MSMT LEADCHNL RV PACING THRESHOLD AMPLITUDE: 0.75 V
MDC IDC MSMT LEADCHNL RV PACING THRESHOLD PULSEWIDTH: 0.5 ms
MDC IDC MSMT LEADCHNL RV SENSING INTR AMPL: 12 mV
MDC IDC SET LEADCHNL RV PACING PULSEWIDTH: 0.5 ms
MDC IDC SET LEADCHNL RV SENSING SENSITIVITY: 2 mV
MDC IDC STAT BRADY RA PERCENT PACED: 94 %
Pulse Gen Serial Number: 2296554

## 2017-06-22 NOTE — Telephone Encounter (Signed)
Requested documentation faxed as requested.

## 2017-06-22 NOTE — Telephone Encounter (Signed)
New Message   Sheila Murray called in from [place]. She states that patient was in the office today to get cardiac clearance. She is requesting that the notes be faxed to [phone]. Please call if any questions.

## 2017-06-22 NOTE — Patient Instructions (Signed)
Medication Instructions:  Your physician recommends that you continue on your current medications as directed. Please refer to the Current Medication list given to you today.  Labwork: None ordered.  Testing/Procedures: None ordered.  Follow-Up: Your physician wants you to follow-up in: one year with Dr. Lovena Le.   You will receive a reminder letter in the mail two months in advance. If you don't receive a letter, please call our office to schedule the follow-up appointment.  Remote monitoring is used to monitor your Pacemaker from home. This monitoring reduces the number of office visits required to check your device to one time per year. It allows [place] to keep an eye on the functioning of your device to ensure it is working properly. You are scheduled for a device check from home on 09/21/2017. You may send your transmission at any time that day. If you have a wireless device, the transmission will be sent automatically. After your physician reviews your transmission, you will receive a postcard with your next transmission date.    Any Other Special Instructions Will Be Listed Below (If Applicable).     If you need a refill on your cardiac medications before your next appointment, please call your pharmacy.

## 2017-06-22 NOTE — Progress Notes (Signed)
HPI Sheila Murray returns today after an absence from our arrhythmia clinic. She is a pleasant 77 yo woman with a h/o symptomatic sinus node dysfunction, s/p PPM insertion, chronic tobacco abuse in remission, COPD, and is pending surgery on her shoulder. SHe is in severe pain from her shoulder. She notes that when she stopped smoking in August, her sob resolved. She denies any anginal symptoms when she walks. No peripheral edema. No syncope.  Allergies  Allergen Reactions  . Codeine     REACTION: Nausea     Current Outpatient Medications  Medication Sig Dispense Refill  . aspirin EC 81 MG EC tablet Take 1 tablet (81 mg total) by mouth daily.    . calcium-vitamin D (OSCAL WITH D) 500-200 MG-UNIT per tablet Take 1 tablet by mouth daily.      Marland Kitchen escitalopram (LEXAPRO) 10 MG tablet Take 10 mg by mouth daily.    Marland Kitchen isometheptene-acetaminophen-dichloralphenazone (MIDRIN) 65-100-325 MG capsule Take 1 capsule by mouth 4 (four) times daily as needed for migraine. Maximum 5 capsules in 12 hours for migraine headaches, 8 capsules in 24 hours for tension headaches.    . levothyroxine (SYNTHROID, LEVOTHROID) 200 MCG tablet Take 200 mcg by mouth daily before breakfast.    . levothyroxine (SYNTHROID, LEVOTHROID) 25 MCG tablet Take 25 mcg by mouth daily before breakfast. TAKES WITH 200 MG    . lisinopril (PRINIVIL,ZESTRIL) 10 MG tablet Take 10 mg by mouth daily.     Marland Kitchen LORazepam (ATIVAN) 0.5 MG tablet Take 0.5 mg by mouth as needed for sleep.     . VENTOLIN HFA 108 (90 BASE) MCG/ACT inhaler Inhale 1-2 puffs into the lungs every 4 (four) hours as needed.      No current facility-administered medications for this visit.      Past Medical History:  Diagnosis Date  . COPD (chronic obstructive pulmonary disease) ([place])   . Diverticulosis of colon   . GERD (gastroesophageal reflux disease)   . HTN (hypertension)   . Hypothyroidism   . Other specified cardiac dysrhythmias(427.89)   . Sleep apnea      ROS:   All systems reviewed and negative except as noted in the HPI.   Past Surgical History:  Procedure Laterality Date  . CARDIAC CATHETERIZATION N/A 02/08/2015   Procedure: Left Heart Cath and Coronary Angiography;  Surgeon: Burnell Blanks, MD;  Location: [facility];  Service: Cardiovascular;  Laterality: N/A;  . eyelid surgery    . PACEMAKER INSERTION  2000   St Jude  . STOMACH SURGERY    . VAGINAL HYSTERECTOMY       Family History  Problem Relation Age of Onset  . Angina Mother   . Diabetes Mother   . Prostate cancer Father   . Healthy Brother   . Dementia Mother      Social History   Socioeconomic History  . Marital status: Married    Spouse name: Not on file  . Number of children: Not on file  . Years of education: Not on file  . Highest education level: Not on file  Social Needs  . Financial resource strain: Not on file  . Food insecurity - worry: Not on file  . Food insecurity - inability: Not on file  . Transportation needs - medical: Not on file  . Transportation needs - non-medical: Not on file  Occupational History  . Not on file  Tobacco Use  . Smoking status: Former Smoker  Types: Cigarettes    Last attempt to quit: 06/16/2011    Years since quitting: 6.0  . Smokeless tobacco: Never Used  Substance and Sexual Activity  . Alcohol use: No  . Drug use: Not on file  . Sexual activity: Not on file  Other Topics Concern  . Not on file  Social History Narrative  . Not on file     Pulse 62   Ht 5\' 7"  (1.702 m)   Wt 189 lb (85.7 kg)   BMI 29.60 kg/m  BP - 144/90 Physical Exam:  stable appearing 77 yo woman, NAD HEENT: Unremarkable Neck:  6 cm JVD, no thyromegally Lymphatics:  No adenopathy Back:  No CVA tenderness Lungs:  Clear with no wheezes HEART:  Regular rate rhythm, no murmurs, no rubs, no clicks Abd:  soft, positive bowel sounds, no organomegally, no rebound, no guarding Ext:  2 plus pulses, no edema, no  cyanosis, no clubbing Skin:  No rashes no nodules Neuro:  CN II through XII intact, motor grossly intact  EKG - nsr with atrial pacing.   DEVICE  Normal device function.  See PaceArt for details. We switched her from AAI to DDD pacing with a long AV delay.  Assess/Plan: 1. Sinus node dysfunction - she is asymptomatic, s/p PPM insertion. 2. PPM - her device was reprogrammed to DDD with a long AV delay. 3. HTN - her blood pressure is elevated but probably due to pain.  4. Preoperative evaluation - she is low risk for cardiovascular complications and does not need additional testing. She walks regularly with out limit and is asymptomatic.  Mikle Bosworth.D.

## 2017-06-25 NOTE — Addendum Note (Signed)
Addended by: Claude Manges on: 06/25/2017 11:06 AM   Modules accepted: Orders

## 2017-07-01 DIAGNOSIS — I10 Essential (primary) hypertension: Secondary | ICD-10-CM | POA: Diagnosis not present

## 2017-07-01 DIAGNOSIS — G4733 Obstructive sleep apnea (adult) (pediatric): Secondary | ICD-10-CM | POA: Diagnosis not present

## 2017-07-01 DIAGNOSIS — G8918 Other acute postprocedural pain: Secondary | ICD-10-CM | POA: Diagnosis not present

## 2017-07-01 DIAGNOSIS — S43421A Sprain of right rotator cuff capsule, initial encounter: Secondary | ICD-10-CM | POA: Diagnosis not present

## 2017-07-01 DIAGNOSIS — Z87891 Personal history of nicotine dependence: Secondary | ICD-10-CM | POA: Diagnosis not present

## 2017-07-01 DIAGNOSIS — E7800 Pure hypercholesterolemia, unspecified: Secondary | ICD-10-CM | POA: Diagnosis not present

## 2017-07-01 DIAGNOSIS — M19011 Primary osteoarthritis, right shoulder: Secondary | ICD-10-CM | POA: Diagnosis not present

## 2017-07-01 DIAGNOSIS — J449 Chronic obstructive pulmonary disease, unspecified: Secondary | ICD-10-CM | POA: Diagnosis not present

## 2017-07-01 DIAGNOSIS — M7521 Bicipital tendinitis, right shoulder: Secondary | ICD-10-CM | POA: Diagnosis not present

## 2017-07-01 DIAGNOSIS — M7541 Impingement syndrome of right shoulder: Secondary | ICD-10-CM | POA: Diagnosis not present

## 2017-07-01 DIAGNOSIS — M24111 Other articular cartilage disorders, right shoulder: Secondary | ICD-10-CM | POA: Diagnosis not present

## 2017-07-01 DIAGNOSIS — M25511 Pain in right shoulder: Secondary | ICD-10-CM | POA: Diagnosis not present

## 2017-07-01 DIAGNOSIS — K219 Gastro-esophageal reflux disease without esophagitis: Secondary | ICD-10-CM | POA: Diagnosis not present

## 2017-07-01 DIAGNOSIS — M75121 Complete rotator cuff tear or rupture of right shoulder, not specified as traumatic: Secondary | ICD-10-CM | POA: Diagnosis not present

## 2017-07-01 DIAGNOSIS — M94211 Chondromalacia, right shoulder: Secondary | ICD-10-CM | POA: Diagnosis not present

## 2017-07-28 DIAGNOSIS — R69 Illness, unspecified: Secondary | ICD-10-CM | POA: Diagnosis not present

## 2017-07-28 DIAGNOSIS — I10 Essential (primary) hypertension: Secondary | ICD-10-CM | POA: Diagnosis not present

## 2017-07-28 DIAGNOSIS — E032 Hypothyroidism due to medicaments and other exogenous substances: Secondary | ICD-10-CM | POA: Diagnosis not present

## 2017-07-28 DIAGNOSIS — E039 Hypothyroidism, unspecified: Secondary | ICD-10-CM | POA: Diagnosis not present

## 2017-07-28 DIAGNOSIS — E0789 Other specified disorders of thyroid: Secondary | ICD-10-CM | POA: Diagnosis not present

## 2017-08-12 DIAGNOSIS — I10 Essential (primary) hypertension: Secondary | ICD-10-CM | POA: Diagnosis not present

## 2017-08-12 DIAGNOSIS — R69 Illness, unspecified: Secondary | ICD-10-CM | POA: Diagnosis not present

## 2017-08-12 DIAGNOSIS — E039 Hypothyroidism, unspecified: Secondary | ICD-10-CM | POA: Diagnosis not present

## 2017-08-16 DIAGNOSIS — M25511 Pain in right shoulder: Secondary | ICD-10-CM | POA: Diagnosis not present

## 2017-08-16 DIAGNOSIS — Z9889 Other specified postprocedural states: Secondary | ICD-10-CM | POA: Diagnosis not present

## 2017-08-19 DIAGNOSIS — M25511 Pain in right shoulder: Secondary | ICD-10-CM | POA: Diagnosis not present

## 2017-08-19 DIAGNOSIS — Z9889 Other specified postprocedural states: Secondary | ICD-10-CM | POA: Diagnosis not present

## 2017-08-24 DIAGNOSIS — M25511 Pain in right shoulder: Secondary | ICD-10-CM | POA: Diagnosis not present

## 2017-08-24 DIAGNOSIS — Z9889 Other specified postprocedural states: Secondary | ICD-10-CM | POA: Diagnosis not present

## 2017-08-27 DIAGNOSIS — Z9889 Other specified postprocedural states: Secondary | ICD-10-CM | POA: Diagnosis not present

## 2017-08-27 DIAGNOSIS — M25511 Pain in right shoulder: Secondary | ICD-10-CM | POA: Diagnosis not present

## 2017-08-30 DIAGNOSIS — Y998 Other external cause status: Secondary | ICD-10-CM | POA: Diagnosis not present

## 2017-08-30 DIAGNOSIS — M549 Dorsalgia, unspecified: Secondary | ICD-10-CM | POA: Diagnosis not present

## 2017-08-30 DIAGNOSIS — M542 Cervicalgia: Secondary | ICD-10-CM | POA: Diagnosis not present

## 2017-08-30 DIAGNOSIS — W01198A Fall on same level from slipping, tripping and stumbling with subsequent striking against other object, initial encounter: Secondary | ICD-10-CM | POA: Diagnosis not present

## 2017-08-30 DIAGNOSIS — M546 Pain in thoracic spine: Secondary | ICD-10-CM | POA: Diagnosis not present

## 2017-08-30 DIAGNOSIS — R42 Dizziness and giddiness: Secondary | ICD-10-CM | POA: Diagnosis not present

## 2017-08-30 DIAGNOSIS — M47816 Spondylosis without myelopathy or radiculopathy, lumbar region: Secondary | ICD-10-CM | POA: Diagnosis not present

## 2017-08-30 DIAGNOSIS — S0003XA Contusion of scalp, initial encounter: Secondary | ICD-10-CM | POA: Diagnosis not present

## 2017-08-31 DIAGNOSIS — R42 Dizziness and giddiness: Secondary | ICD-10-CM | POA: Diagnosis not present

## 2017-09-01 DIAGNOSIS — Z9181 History of falling: Secondary | ICD-10-CM | POA: Diagnosis not present

## 2017-09-01 DIAGNOSIS — I10 Essential (primary) hypertension: Secondary | ICD-10-CM | POA: Diagnosis not present

## 2017-09-03 DIAGNOSIS — Z9889 Other specified postprocedural states: Secondary | ICD-10-CM | POA: Diagnosis not present

## 2017-09-03 DIAGNOSIS — M25511 Pain in right shoulder: Secondary | ICD-10-CM | POA: Diagnosis not present

## 2017-09-07 DIAGNOSIS — Z9889 Other specified postprocedural states: Secondary | ICD-10-CM | POA: Diagnosis not present

## 2017-09-07 DIAGNOSIS — M25511 Pain in right shoulder: Secondary | ICD-10-CM | POA: Diagnosis not present

## 2017-09-10 DIAGNOSIS — Z9889 Other specified postprocedural states: Secondary | ICD-10-CM | POA: Diagnosis not present

## 2017-09-10 DIAGNOSIS — M25511 Pain in right shoulder: Secondary | ICD-10-CM | POA: Diagnosis not present

## 2017-09-14 DIAGNOSIS — Z9889 Other specified postprocedural states: Secondary | ICD-10-CM | POA: Diagnosis not present

## 2017-09-14 DIAGNOSIS — M25511 Pain in right shoulder: Secondary | ICD-10-CM | POA: Diagnosis not present

## 2017-09-17 DIAGNOSIS — M25511 Pain in right shoulder: Secondary | ICD-10-CM | POA: Diagnosis not present

## 2017-09-17 DIAGNOSIS — Z9889 Other specified postprocedural states: Secondary | ICD-10-CM | POA: Diagnosis not present

## 2017-09-21 ENCOUNTER — Telehealth: Payer: Self-pay | Admitting: Cardiology

## 2017-09-21 ENCOUNTER — Ambulatory Visit (INDEPENDENT_AMBULATORY_CARE_PROVIDER_SITE_OTHER): Payer: Medicare HMO | Admitting: *Deleted

## 2017-09-21 DIAGNOSIS — M25511 Pain in right shoulder: Secondary | ICD-10-CM | POA: Diagnosis not present

## 2017-09-21 DIAGNOSIS — Z9889 Other specified postprocedural states: Secondary | ICD-10-CM | POA: Diagnosis not present

## 2017-09-21 DIAGNOSIS — R001 Bradycardia, unspecified: Secondary | ICD-10-CM

## 2017-09-21 NOTE — Telephone Encounter (Signed)
Spoke with pt and reminded pt of remote transmission that is due today. Pt verbalized understanding.   

## 2017-09-22 NOTE — Progress Notes (Signed)
Remote pacemaker transmission.   

## 2017-09-23 ENCOUNTER — Encounter: Payer: Self-pay | Admitting: Cardiology

## 2017-09-23 DIAGNOSIS — M25511 Pain in right shoulder: Secondary | ICD-10-CM | POA: Diagnosis not present

## 2017-09-23 DIAGNOSIS — Z9889 Other specified postprocedural states: Secondary | ICD-10-CM | POA: Diagnosis not present

## 2017-09-27 DIAGNOSIS — Z9889 Other specified postprocedural states: Secondary | ICD-10-CM | POA: Diagnosis not present

## 2017-09-27 DIAGNOSIS — M25511 Pain in right shoulder: Secondary | ICD-10-CM | POA: Diagnosis not present

## 2017-09-29 DIAGNOSIS — E032 Hypothyroidism due to medicaments and other exogenous substances: Secondary | ICD-10-CM | POA: Diagnosis not present

## 2017-09-29 DIAGNOSIS — I10 Essential (primary) hypertension: Secondary | ICD-10-CM | POA: Diagnosis not present

## 2017-09-29 DIAGNOSIS — E789 Disorder of lipoprotein metabolism, unspecified: Secondary | ICD-10-CM | POA: Diagnosis not present

## 2017-10-05 DIAGNOSIS — Z9889 Other specified postprocedural states: Secondary | ICD-10-CM | POA: Diagnosis not present

## 2017-10-05 DIAGNOSIS — M25511 Pain in right shoulder: Secondary | ICD-10-CM | POA: Diagnosis not present

## 2017-10-07 DIAGNOSIS — M25511 Pain in right shoulder: Secondary | ICD-10-CM | POA: Diagnosis not present

## 2017-10-07 DIAGNOSIS — Z9889 Other specified postprocedural states: Secondary | ICD-10-CM | POA: Diagnosis not present

## 2017-10-12 DIAGNOSIS — M25511 Pain in right shoulder: Secondary | ICD-10-CM | POA: Diagnosis not present

## 2017-10-12 DIAGNOSIS — Z9889 Other specified postprocedural states: Secondary | ICD-10-CM | POA: Diagnosis not present

## 2017-10-14 DIAGNOSIS — Z9889 Other specified postprocedural states: Secondary | ICD-10-CM | POA: Diagnosis not present

## 2017-10-14 DIAGNOSIS — M25511 Pain in right shoulder: Secondary | ICD-10-CM | POA: Diagnosis not present

## 2017-10-19 DIAGNOSIS — Z9889 Other specified postprocedural states: Secondary | ICD-10-CM | POA: Diagnosis not present

## 2017-10-19 DIAGNOSIS — M25511 Pain in right shoulder: Secondary | ICD-10-CM | POA: Diagnosis not present

## 2017-10-20 LAB — CUP PACEART REMOTE DEVICE CHECK
Brady Statistic AP VP Percent: 1 %
Brady Statistic AS VP Percent: 1 %
Brady Statistic RA Percent Paced: 90 %
Brady Statistic RV Percent Paced: 1 %
Implantable Lead Implant Date: 20001030
Implantable Lead Location: 753859
Lead Channel Impedance Value: 590 Ohm
Lead Channel Pacing Threshold Pulse Width: 0.5 ms
Lead Channel Pacing Threshold Pulse Width: 0.5 ms
Lead Channel Sensing Intrinsic Amplitude: 8.5 mV
Lead Channel Setting Pacing Amplitude: 2 V
Lead Channel Setting Pacing Amplitude: 2.5 V
Lead Channel Setting Sensing Sensitivity: 2 mV
MDC IDC LEAD IMPLANT DT: 20110216
MDC IDC LEAD LOCATION: 753860
MDC IDC MSMT BATTERY REMAINING LONGEVITY: 17 mo
MDC IDC MSMT BATTERY REMAINING PERCENTAGE: 15 %
MDC IDC MSMT BATTERY VOLTAGE: 2.74 V
MDC IDC MSMT LEADCHNL RA IMPEDANCE VALUE: 440 Ohm
MDC IDC MSMT LEADCHNL RA PACING THRESHOLD AMPLITUDE: 0.75 V
MDC IDC MSMT LEADCHNL RA SENSING INTR AMPL: 4.1 mV
MDC IDC MSMT LEADCHNL RV PACING THRESHOLD AMPLITUDE: 0.75 V
MDC IDC PG IMPLANT DT: 20100809
MDC IDC PG SERIAL: 2296554
MDC IDC SESS DTM: 20190409190357
MDC IDC SET LEADCHNL RV PACING PULSEWIDTH: 0.5 ms
MDC IDC STAT BRADY AP VS PERCENT: 91 %
MDC IDC STAT BRADY AS VS PERCENT: 8.8 %
Pulse Gen Model: 2110

## 2017-10-24 DIAGNOSIS — R69 Illness, unspecified: Secondary | ICD-10-CM | POA: Diagnosis not present

## 2017-10-24 DIAGNOSIS — N281 Cyst of kidney, acquired: Secondary | ICD-10-CM | POA: Diagnosis not present

## 2017-10-24 DIAGNOSIS — N200 Calculus of kidney: Secondary | ICD-10-CM | POA: Diagnosis not present

## 2017-10-24 DIAGNOSIS — M1990 Unspecified osteoarthritis, unspecified site: Secondary | ICD-10-CM | POA: Diagnosis not present

## 2017-10-24 DIAGNOSIS — K5732 Diverticulitis of large intestine without perforation or abscess without bleeding: Secondary | ICD-10-CM | POA: Diagnosis not present

## 2017-10-24 DIAGNOSIS — Z87891 Personal history of nicotine dependence: Secondary | ICD-10-CM | POA: Diagnosis not present

## 2017-10-24 DIAGNOSIS — Z950 Presence of cardiac pacemaker: Secondary | ICD-10-CM | POA: Diagnosis not present

## 2017-10-24 DIAGNOSIS — Z79899 Other long term (current) drug therapy: Secondary | ICD-10-CM | POA: Diagnosis not present

## 2017-10-24 DIAGNOSIS — E039 Hypothyroidism, unspecified: Secondary | ICD-10-CM | POA: Diagnosis not present

## 2017-10-24 DIAGNOSIS — K5792 Diverticulitis of intestine, part unspecified, without perforation or abscess without bleeding: Secondary | ICD-10-CM | POA: Diagnosis not present

## 2017-10-24 DIAGNOSIS — K5730 Diverticulosis of large intestine without perforation or abscess without bleeding: Secondary | ICD-10-CM | POA: Diagnosis not present

## 2017-10-24 DIAGNOSIS — K8020 Calculus of gallbladder without cholecystitis without obstruction: Secondary | ICD-10-CM | POA: Diagnosis not present

## 2017-10-24 DIAGNOSIS — Z885 Allergy status to narcotic agent status: Secondary | ICD-10-CM | POA: Diagnosis not present

## 2017-10-24 DIAGNOSIS — M545 Low back pain: Secondary | ICD-10-CM | POA: Diagnosis not present

## 2017-10-24 DIAGNOSIS — E7800 Pure hypercholesterolemia, unspecified: Secondary | ICD-10-CM | POA: Diagnosis not present

## 2017-10-24 DIAGNOSIS — I10 Essential (primary) hypertension: Secondary | ICD-10-CM | POA: Diagnosis not present

## 2017-10-26 DIAGNOSIS — M25511 Pain in right shoulder: Secondary | ICD-10-CM | POA: Diagnosis not present

## 2017-10-26 DIAGNOSIS — Z9889 Other specified postprocedural states: Secondary | ICD-10-CM | POA: Diagnosis not present

## 2017-11-01 DIAGNOSIS — K5730 Diverticulosis of large intestine without perforation or abscess without bleeding: Secondary | ICD-10-CM | POA: Diagnosis not present

## 2017-11-01 DIAGNOSIS — Z720 Tobacco use: Secondary | ICD-10-CM | POA: Diagnosis not present

## 2017-11-01 DIAGNOSIS — N200 Calculus of kidney: Secondary | ICD-10-CM | POA: Diagnosis not present

## 2017-11-01 DIAGNOSIS — N281 Cyst of kidney, acquired: Secondary | ICD-10-CM | POA: Diagnosis not present

## 2017-11-01 DIAGNOSIS — K8020 Calculus of gallbladder without cholecystitis without obstruction: Secondary | ICD-10-CM | POA: Diagnosis not present

## 2017-11-02 DIAGNOSIS — M549 Dorsalgia, unspecified: Secondary | ICD-10-CM | POA: Diagnosis not present

## 2017-11-02 DIAGNOSIS — Z9889 Other specified postprocedural states: Secondary | ICD-10-CM | POA: Diagnosis not present

## 2017-11-10 DIAGNOSIS — M546 Pain in thoracic spine: Secondary | ICD-10-CM | POA: Diagnosis not present

## 2017-11-10 DIAGNOSIS — N3941 Urge incontinence: Secondary | ICD-10-CM | POA: Diagnosis not present

## 2017-11-10 DIAGNOSIS — N281 Cyst of kidney, acquired: Secondary | ICD-10-CM | POA: Diagnosis not present

## 2017-11-10 DIAGNOSIS — N200 Calculus of kidney: Secondary | ICD-10-CM | POA: Diagnosis not present

## 2017-11-29 DIAGNOSIS — M5137 Other intervertebral disc degeneration, lumbosacral region: Secondary | ICD-10-CM | POA: Diagnosis not present

## 2017-11-29 DIAGNOSIS — M5416 Radiculopathy, lumbar region: Secondary | ICD-10-CM | POA: Diagnosis not present

## 2017-12-06 DIAGNOSIS — J449 Chronic obstructive pulmonary disease, unspecified: Secondary | ICD-10-CM | POA: Diagnosis not present

## 2017-12-06 DIAGNOSIS — D127 Benign neoplasm of rectosigmoid junction: Secondary | ICD-10-CM | POA: Diagnosis not present

## 2017-12-06 DIAGNOSIS — I10 Essential (primary) hypertension: Secondary | ICD-10-CM | POA: Diagnosis not present

## 2017-12-06 DIAGNOSIS — D125 Benign neoplasm of sigmoid colon: Secondary | ICD-10-CM | POA: Diagnosis not present

## 2017-12-06 DIAGNOSIS — Z1211 Encounter for screening for malignant neoplasm of colon: Secondary | ICD-10-CM | POA: Diagnosis not present

## 2017-12-06 DIAGNOSIS — Q438 Other specified congenital malformations of intestine: Secondary | ICD-10-CM | POA: Diagnosis not present

## 2017-12-06 DIAGNOSIS — K5730 Diverticulosis of large intestine without perforation or abscess without bleeding: Secondary | ICD-10-CM | POA: Diagnosis not present

## 2017-12-06 DIAGNOSIS — K644 Residual hemorrhoidal skin tags: Secondary | ICD-10-CM | POA: Diagnosis not present

## 2017-12-06 DIAGNOSIS — K635 Polyp of colon: Secondary | ICD-10-CM | POA: Diagnosis not present

## 2017-12-06 DIAGNOSIS — K621 Rectal polyp: Secondary | ICD-10-CM | POA: Diagnosis not present

## 2017-12-06 DIAGNOSIS — E669 Obesity, unspecified: Secondary | ICD-10-CM | POA: Diagnosis not present

## 2017-12-06 DIAGNOSIS — D123 Benign neoplasm of transverse colon: Secondary | ICD-10-CM | POA: Diagnosis not present

## 2017-12-06 DIAGNOSIS — Z6826 Body mass index (BMI) 26.0-26.9, adult: Secondary | ICD-10-CM | POA: Diagnosis not present

## 2017-12-06 DIAGNOSIS — E039 Hypothyroidism, unspecified: Secondary | ICD-10-CM | POA: Diagnosis not present

## 2017-12-13 DIAGNOSIS — M545 Low back pain: Secondary | ICD-10-CM | POA: Diagnosis not present

## 2017-12-14 DIAGNOSIS — K5730 Diverticulosis of large intestine without perforation or abscess without bleeding: Secondary | ICD-10-CM | POA: Diagnosis not present

## 2017-12-14 DIAGNOSIS — Z86010 Personal history of colonic polyps: Secondary | ICD-10-CM | POA: Diagnosis not present

## 2017-12-21 ENCOUNTER — Ambulatory Visit (INDEPENDENT_AMBULATORY_CARE_PROVIDER_SITE_OTHER): Payer: Medicare HMO | Admitting: *Deleted

## 2017-12-21 ENCOUNTER — Telehealth: Payer: Self-pay | Admitting: Cardiology

## 2017-12-21 DIAGNOSIS — R001 Bradycardia, unspecified: Secondary | ICD-10-CM

## 2017-12-21 NOTE — Telephone Encounter (Signed)
Spoke with pt and reminded pt of remote transmission that is due today. Pt verbalized understanding.   

## 2017-12-22 NOTE — Progress Notes (Signed)
Remote pacemaker transmission.   

## 2017-12-28 ENCOUNTER — Telehealth: Payer: Self-pay | Admitting: Internal Medicine

## 2017-12-28 NOTE — Telephone Encounter (Signed)
Left message stating returning Pt's call.  Advised Pt may call office or this nurse will attempt 2nd outreach later.  Left this nurse name and # for call back.

## 2017-12-28 NOTE — Telephone Encounter (Signed)
New message   Patient calling with concerns of HR.  Patient c/o Palpitations:  High priority if patient c/o lightheadedness, shortness of breath, or chest pain  1) How long have you had palpitations/irregular HR/ Afib? Are you having the symptoms now? 2 months  2) Are you currently experiencing lightheadedness, SOB or CP? Dizziness, lightheaded  Do you have a history of afib (atrial fibrillation) or irregular heart rhythm? YES 3) Have you checked your BP or HR? (document readings if available): 130/83, 117/78  4) Are you experiencing any other symptoms? Dizziness, sweats, lightheaded   STAT if HR is under 50 or over 120 (normal HR is 60-100 beats per minute)  1) What is your heart rate? 87  2) Do you have a log of your heart rate readings (document readings)? 65-->70  3) Do you have any other symptoms? no

## 2017-12-30 DIAGNOSIS — K5730 Diverticulosis of large intestine without perforation or abscess without bleeding: Secondary | ICD-10-CM | POA: Diagnosis not present

## 2017-12-30 DIAGNOSIS — Z978 Presence of other specified devices: Secondary | ICD-10-CM | POA: Diagnosis not present

## 2017-12-30 NOTE — Telephone Encounter (Signed)
Left VM for Pt advising of calling back  Outreach # 2

## 2018-01-03 DIAGNOSIS — R634 Abnormal weight loss: Secondary | ICD-10-CM | POA: Diagnosis not present

## 2018-01-03 DIAGNOSIS — M5136 Other intervertebral disc degeneration, lumbar region: Secondary | ICD-10-CM | POA: Diagnosis not present

## 2018-01-03 DIAGNOSIS — E039 Hypothyroidism, unspecified: Secondary | ICD-10-CM | POA: Diagnosis not present

## 2018-01-04 ENCOUNTER — Other Ambulatory Visit: Payer: Self-pay | Admitting: Internal Medicine

## 2018-01-04 DIAGNOSIS — Z1231 Encounter for screening mammogram for malignant neoplasm of breast: Secondary | ICD-10-CM

## 2018-01-07 DIAGNOSIS — M545 Low back pain: Secondary | ICD-10-CM | POA: Diagnosis not present

## 2018-01-13 DIAGNOSIS — M545 Low back pain: Secondary | ICD-10-CM | POA: Diagnosis not present

## 2018-01-13 DIAGNOSIS — G8929 Other chronic pain: Secondary | ICD-10-CM | POA: Diagnosis not present

## 2018-01-13 DIAGNOSIS — M5416 Radiculopathy, lumbar region: Secondary | ICD-10-CM | POA: Diagnosis not present

## 2018-01-15 LAB — CUP PACEART REMOTE DEVICE CHECK
Battery Remaining Longevity: 12 mo
Battery Remaining Percentage: 10 %
Brady Statistic AP VS Percent: 91 %
Brady Statistic RA Percent Paced: 90 %
Date Time Interrogation Session: 20190709192258
Implantable Lead Implant Date: 20001030
Implantable Lead Location: 753860
Lead Channel Impedance Value: 450 Ohm
Lead Channel Pacing Threshold Amplitude: 0.75 V
Lead Channel Pacing Threshold Pulse Width: 0.5 ms
Lead Channel Sensing Intrinsic Amplitude: 2.5 mV
Lead Channel Setting Pacing Pulse Width: 0.5 ms
MDC IDC LEAD IMPLANT DT: 20110216
MDC IDC LEAD LOCATION: 753859
MDC IDC MSMT BATTERY VOLTAGE: 2.71 V
MDC IDC MSMT LEADCHNL RA PACING THRESHOLD AMPLITUDE: 0.75 V
MDC IDC MSMT LEADCHNL RV IMPEDANCE VALUE: 600 Ohm
MDC IDC MSMT LEADCHNL RV PACING THRESHOLD PULSEWIDTH: 0.5 ms
MDC IDC MSMT LEADCHNL RV SENSING INTR AMPL: 7.9 mV
MDC IDC PG IMPLANT DT: 20100809
MDC IDC SET LEADCHNL RA PACING AMPLITUDE: 2 V
MDC IDC SET LEADCHNL RV PACING AMPLITUDE: 2.5 V
MDC IDC SET LEADCHNL RV SENSING SENSITIVITY: 2 mV
MDC IDC STAT BRADY AP VP PERCENT: 1 %
MDC IDC STAT BRADY AS VP PERCENT: 1 %
MDC IDC STAT BRADY AS VS PERCENT: 8.8 %
MDC IDC STAT BRADY RV PERCENT PACED: 1 %
Pulse Gen Model: 2110
Pulse Gen Serial Number: 2296554

## 2018-01-17 DIAGNOSIS — R69 Illness, unspecified: Secondary | ICD-10-CM | POA: Diagnosis not present

## 2018-01-19 NOTE — Telephone Encounter (Signed)
2 outreaches attempted.   Pt did not return call.  Will await further needs.

## 2018-01-25 ENCOUNTER — Ambulatory Visit
Admission: RE | Admit: 2018-01-25 | Discharge: 2018-01-25 | Disposition: A | Discharge status: Home / Self Care | Payer: Medicare HMO | Source: Ambulatory Visit | Attending: Internal Medicine | Admitting: Internal Medicine

## 2018-01-25 DIAGNOSIS — Z1231 Encounter for screening mammogram for malignant neoplasm of breast: Secondary | ICD-10-CM

## 2018-02-03 DIAGNOSIS — M545 Low back pain: Secondary | ICD-10-CM | POA: Diagnosis not present

## 2018-02-08 DIAGNOSIS — K610 Anal abscess: Secondary | ICD-10-CM | POA: Diagnosis not present

## 2018-02-08 DIAGNOSIS — Z008 Encounter for other general examination: Secondary | ICD-10-CM | POA: Diagnosis not present

## 2018-02-08 DIAGNOSIS — Z0189 Encounter for other specified special examinations: Secondary | ICD-10-CM | POA: Diagnosis not present

## 2018-02-17 DIAGNOSIS — E039 Hypothyroidism, unspecified: Secondary | ICD-10-CM | POA: Diagnosis not present

## 2018-02-17 DIAGNOSIS — I10 Essential (primary) hypertension: Secondary | ICD-10-CM | POA: Diagnosis not present

## 2018-02-17 DIAGNOSIS — G8929 Other chronic pain: Secondary | ICD-10-CM | POA: Diagnosis not present

## 2018-02-17 DIAGNOSIS — M545 Low back pain: Secondary | ICD-10-CM | POA: Diagnosis not present

## 2018-02-17 DIAGNOSIS — R634 Abnormal weight loss: Secondary | ICD-10-CM | POA: Diagnosis not present

## 2018-02-17 DIAGNOSIS — Z87891 Personal history of nicotine dependence: Secondary | ICD-10-CM | POA: Diagnosis not present

## 2018-02-17 DIAGNOSIS — R5383 Other fatigue: Secondary | ICD-10-CM | POA: Diagnosis not present

## 2018-02-17 DIAGNOSIS — N390 Urinary tract infection, site not specified: Secondary | ICD-10-CM | POA: Diagnosis not present

## 2018-02-22 DIAGNOSIS — I10 Essential (primary) hypertension: Secondary | ICD-10-CM | POA: Diagnosis not present

## 2018-02-22 DIAGNOSIS — M5440 Lumbago with sciatica, unspecified side: Secondary | ICD-10-CM | POA: Diagnosis not present

## 2018-02-23 ENCOUNTER — Other Ambulatory Visit: Payer: Self-pay | Admitting: Neurosurgery

## 2018-02-23 DIAGNOSIS — M5440 Lumbago with sciatica, unspecified side: Secondary | ICD-10-CM

## 2018-03-01 DIAGNOSIS — M25511 Pain in right shoulder: Secondary | ICD-10-CM | POA: Diagnosis not present

## 2018-03-01 DIAGNOSIS — Z9889 Other specified postprocedural states: Secondary | ICD-10-CM | POA: Diagnosis not present

## 2018-03-03 DIAGNOSIS — R222 Localized swelling, mass and lump, trunk: Secondary | ICD-10-CM | POA: Diagnosis not present

## 2018-03-03 DIAGNOSIS — I10 Essential (primary) hypertension: Secondary | ICD-10-CM | POA: Diagnosis not present

## 2018-03-03 DIAGNOSIS — E039 Hypothyroidism, unspecified: Secondary | ICD-10-CM | POA: Diagnosis not present

## 2018-03-03 DIAGNOSIS — R634 Abnormal weight loss: Secondary | ICD-10-CM | POA: Diagnosis not present

## 2018-03-03 DIAGNOSIS — R5383 Other fatigue: Secondary | ICD-10-CM | POA: Diagnosis not present

## 2018-03-08 ENCOUNTER — Other Ambulatory Visit: Payer: Self-pay | Admitting: Internal Medicine

## 2018-03-09 ENCOUNTER — Ambulatory Visit
Admission: RE | Admit: 2018-03-09 | Discharge: 2018-03-09 | Disposition: A | Discharge status: Home / Self Care | Payer: Medicare HMO | Source: Ambulatory Visit | Attending: Neurosurgery | Admitting: Neurosurgery

## 2018-03-09 DIAGNOSIS — M545 Low back pain: Secondary | ICD-10-CM | POA: Diagnosis not present

## 2018-03-09 DIAGNOSIS — M5440 Lumbago with sciatica, unspecified side: Secondary | ICD-10-CM

## 2018-03-09 MED ORDER — METHYLPREDNISOLONE ACETATE 40 MG/ML INJ SUSP (RADIOLOG
120.0000 mg | Freq: Once | INTRAMUSCULAR | Status: AC
  Administered 2018-03-09: 120 mg via INTRA_ARTICULAR

## 2018-03-09 MED ORDER — IOPAMIDOL (ISOVUE-M 200) INJECTION 41%
1.0000 mL | Freq: Once | INTRAMUSCULAR | Status: AC
  Administered 2018-03-09: 1 mL via INTRA_ARTICULAR

## 2018-03-09 NOTE — Discharge Instructions (Signed)

## 2018-03-14 ENCOUNTER — Other Ambulatory Visit: Payer: Self-pay | Admitting: Internal Medicine

## 2018-03-14 DIAGNOSIS — R222 Localized swelling, mass and lump, trunk: Principal | ICD-10-CM

## 2018-03-14 DIAGNOSIS — M533 Sacrococcygeal disorders, not elsewhere classified: Secondary | ICD-10-CM

## 2018-03-16 DIAGNOSIS — E039 Hypothyroidism, unspecified: Secondary | ICD-10-CM | POA: Diagnosis not present

## 2018-03-16 DIAGNOSIS — I10 Essential (primary) hypertension: Secondary | ICD-10-CM | POA: Diagnosis not present

## 2018-03-16 DIAGNOSIS — Z0000 Encounter for general adult medical examination without abnormal findings: Secondary | ICD-10-CM | POA: Diagnosis not present

## 2018-03-16 DIAGNOSIS — Z780 Asymptomatic menopausal state: Secondary | ICD-10-CM | POA: Diagnosis not present

## 2018-03-16 DIAGNOSIS — M8580 Other specified disorders of bone density and structure, unspecified site: Secondary | ICD-10-CM | POA: Diagnosis not present

## 2018-03-21 ENCOUNTER — Ambulatory Visit
Admission: RE | Admit: 2018-03-21 | Discharge: 2018-03-21 | Disposition: A | Discharge status: Home / Self Care | Payer: Medicare HMO | Source: Ambulatory Visit | Attending: Internal Medicine | Admitting: Internal Medicine

## 2018-03-21 DIAGNOSIS — M533 Sacrococcygeal disorders, not elsewhere classified: Secondary | ICD-10-CM

## 2018-03-21 DIAGNOSIS — R222 Localized swelling, mass and lump, trunk: Principal | ICD-10-CM

## 2018-03-21 DIAGNOSIS — R937 Abnormal findings on diagnostic imaging of other parts of musculoskeletal system: Secondary | ICD-10-CM | POA: Diagnosis not present

## 2018-03-22 ENCOUNTER — Telehealth: Payer: Self-pay | Admitting: Cardiology

## 2018-03-22 ENCOUNTER — Encounter: Payer: Medicare HMO | Admitting: *Deleted

## 2018-03-22 NOTE — Telephone Encounter (Signed)
LMOVM reminding pt to send remote transmission.   

## 2018-03-23 DIAGNOSIS — M8580 Other specified disorders of bone density and structure, unspecified site: Secondary | ICD-10-CM | POA: Diagnosis not present

## 2018-03-23 DIAGNOSIS — I10 Essential (primary) hypertension: Secondary | ICD-10-CM | POA: Diagnosis not present

## 2018-03-30 ENCOUNTER — Encounter: Payer: Self-pay | Admitting: Cardiology

## 2018-03-31 ENCOUNTER — Encounter: Payer: Self-pay | Admitting: Cardiology

## 2018-03-31 DIAGNOSIS — M5440 Lumbago with sciatica, unspecified side: Secondary | ICD-10-CM | POA: Diagnosis not present

## 2018-04-04 DIAGNOSIS — G8929 Other chronic pain: Secondary | ICD-10-CM | POA: Diagnosis not present

## 2018-04-04 DIAGNOSIS — Z79899 Other long term (current) drug therapy: Secondary | ICD-10-CM | POA: Diagnosis not present

## 2018-04-04 DIAGNOSIS — J449 Chronic obstructive pulmonary disease, unspecified: Secondary | ICD-10-CM | POA: Diagnosis not present

## 2018-04-04 DIAGNOSIS — M549 Dorsalgia, unspecified: Secondary | ICD-10-CM | POA: Diagnosis not present

## 2018-04-04 DIAGNOSIS — R222 Localized swelling, mass and lump, trunk: Secondary | ICD-10-CM | POA: Diagnosis not present

## 2018-04-04 DIAGNOSIS — I10 Essential (primary) hypertension: Secondary | ICD-10-CM | POA: Diagnosis not present

## 2018-04-11 ENCOUNTER — Ambulatory Visit (INDEPENDENT_AMBULATORY_CARE_PROVIDER_SITE_OTHER): Payer: Medicare HMO | Admitting: *Deleted

## 2018-04-11 DIAGNOSIS — R001 Bradycardia, unspecified: Secondary | ICD-10-CM

## 2018-04-11 NOTE — Progress Notes (Signed)
Remote pacemaker transmission.   

## 2018-04-12 ENCOUNTER — Other Ambulatory Visit: Payer: Self-pay | Admitting: Neurosurgery

## 2018-04-12 DIAGNOSIS — M5440 Lumbago with sciatica, unspecified side: Secondary | ICD-10-CM

## 2018-04-25 DIAGNOSIS — Z01818 Encounter for other preprocedural examination: Secondary | ICD-10-CM | POA: Diagnosis not present

## 2018-05-02 DIAGNOSIS — M7989 Other specified soft tissue disorders: Secondary | ICD-10-CM | POA: Diagnosis not present

## 2018-05-02 DIAGNOSIS — M1990 Unspecified osteoarthritis, unspecified site: Secondary | ICD-10-CM | POA: Diagnosis not present

## 2018-05-02 DIAGNOSIS — J449 Chronic obstructive pulmonary disease, unspecified: Secondary | ICD-10-CM | POA: Diagnosis not present

## 2018-05-02 DIAGNOSIS — I2510 Atherosclerotic heart disease of native coronary artery without angina pectoris: Secondary | ICD-10-CM | POA: Diagnosis not present

## 2018-05-02 DIAGNOSIS — G8929 Other chronic pain: Secondary | ICD-10-CM | POA: Diagnosis not present

## 2018-05-02 DIAGNOSIS — M545 Low back pain: Secondary | ICD-10-CM | POA: Diagnosis not present

## 2018-05-02 DIAGNOSIS — R222 Localized swelling, mass and lump, trunk: Secondary | ICD-10-CM | POA: Diagnosis not present

## 2018-05-02 DIAGNOSIS — R69 Illness, unspecified: Secondary | ICD-10-CM | POA: Diagnosis not present

## 2018-05-02 DIAGNOSIS — Z79899 Other long term (current) drug therapy: Secondary | ICD-10-CM | POA: Diagnosis not present

## 2018-05-02 DIAGNOSIS — I10 Essential (primary) hypertension: Secondary | ICD-10-CM | POA: Diagnosis not present

## 2018-05-02 DIAGNOSIS — M25511 Pain in right shoulder: Secondary | ICD-10-CM | POA: Diagnosis not present

## 2018-05-27 DIAGNOSIS — G8929 Other chronic pain: Secondary | ICD-10-CM | POA: Diagnosis not present

## 2018-05-27 DIAGNOSIS — M545 Low back pain: Secondary | ICD-10-CM | POA: Diagnosis not present

## 2018-05-31 DIAGNOSIS — R69 Illness, unspecified: Secondary | ICD-10-CM | POA: Diagnosis not present

## 2018-06-03 ENCOUNTER — Ambulatory Visit
Admission: RE | Admit: 2018-06-03 | Discharge: 2018-06-03 | Disposition: A | Discharge status: Home / Self Care | Payer: Medicare HMO | Source: Ambulatory Visit | Attending: Neurosurgery | Admitting: Neurosurgery

## 2018-06-03 ENCOUNTER — Other Ambulatory Visit: Payer: Self-pay | Admitting: Neurosurgery

## 2018-06-03 DIAGNOSIS — M5440 Lumbago with sciatica, unspecified side: Secondary | ICD-10-CM

## 2018-06-03 DIAGNOSIS — M545 Low back pain: Secondary | ICD-10-CM | POA: Diagnosis not present

## 2018-06-03 MED ORDER — IOPAMIDOL (ISOVUE-M 300) INJECTION 61%
1.0000 mL | Freq: Once | INTRAMUSCULAR | Status: AC | PRN
  Administered 2018-06-03: 1 mL via INTRA_ARTICULAR

## 2018-06-03 MED ORDER — TRIAMCINOLONE ACETONIDE 40 MG/ML IJ SUSP (RADIOLOGY)
60.0000 mg | Freq: Once | INTRAMUSCULAR | Status: AC
  Administered 2018-06-03: 60 mg via INTRA_ARTICULAR

## 2018-06-14 LAB — CUP PACEART REMOTE DEVICE CHECK
Battery Remaining Longevity: 1 mo
Battery Remaining Percentage: 1 %
Battery Voltage: 2.63 V
Brady Statistic AP VP Percent: 1 %
Brady Statistic AP VS Percent: 92 %
Brady Statistic AS VS Percent: 8.1 %
Brady Statistic RA Percent Paced: 91 %
Brady Statistic RV Percent Paced: 1 %
Date Time Interrogation Session: 20191028153722
Implantable Lead Implant Date: 20001030
Implantable Lead Implant Date: 20110216
Implantable Lead Location: 753859
Implantable Lead Location: 753860
Implantable Pulse Generator Implant Date: 20100809
Lead Channel Impedance Value: 400 Ohm
Lead Channel Impedance Value: 540 Ohm
Lead Channel Pacing Threshold Amplitude: 0.75 V
Lead Channel Pacing Threshold Amplitude: 0.75 V
Lead Channel Pacing Threshold Pulse Width: 0.5 ms
Lead Channel Pacing Threshold Pulse Width: 0.5 ms
Lead Channel Sensing Intrinsic Amplitude: 5 mV
Lead Channel Sensing Intrinsic Amplitude: 7.9 mV
Lead Channel Setting Pacing Amplitude: 2 V
Lead Channel Setting Pacing Amplitude: 2.5 V
Lead Channel Setting Pacing Pulse Width: 0.5 ms
Lead Channel Setting Sensing Sensitivity: 2 mV
MDC IDC STAT BRADY AS VP PERCENT: 1 %
Pulse Gen Model: 2110
Pulse Gen Serial Number: 2296554

## 2018-06-27 ENCOUNTER — Encounter: Payer: Medicare HMO | Admitting: Internal Medicine

## 2018-06-27 ENCOUNTER — Telehealth: Payer: Self-pay | Admitting: *Deleted

## 2018-06-27 NOTE — Telephone Encounter (Signed)
LMOVM (DPR) requesting that patient send a transmission from her PPM home monitor as she rescheduled f/u appointment with Dr. Lovena Le today. [place] phone number for questions/concerns.

## 2018-06-28 NOTE — Telephone Encounter (Signed)
Called pt to request that she send a manual transmission. She sent manual transmission on 06-27-2018. Pt aware.

## 2018-06-28 NOTE — Telephone Encounter (Signed)
Transmission reviewed. ERI in <3 months, plan to keep remote transmission scheduled for 07/11/18.

## 2018-06-30 DIAGNOSIS — M545 Low back pain: Secondary | ICD-10-CM | POA: Diagnosis not present

## 2018-07-01 DIAGNOSIS — F329 Major depressive disorder, single episode, unspecified: Secondary | ICD-10-CM | POA: Diagnosis not present

## 2018-07-01 DIAGNOSIS — E039 Hypothyroidism, unspecified: Secondary | ICD-10-CM | POA: Diagnosis not present

## 2018-07-01 DIAGNOSIS — M8580 Other specified disorders of bone density and structure, unspecified site: Secondary | ICD-10-CM | POA: Diagnosis not present

## 2018-07-01 DIAGNOSIS — E559 Vitamin D deficiency, unspecified: Secondary | ICD-10-CM | POA: Diagnosis not present

## 2018-07-01 DIAGNOSIS — I10 Essential (primary) hypertension: Secondary | ICD-10-CM | POA: Diagnosis not present

## 2018-07-25 ENCOUNTER — Encounter: Payer: Self-pay | Admitting: Cardiology

## 2018-08-18 ENCOUNTER — Ambulatory Visit: Payer: Medicare HMO | Admitting: Internal Medicine

## 2018-08-18 ENCOUNTER — Encounter: Payer: Self-pay | Admitting: Internal Medicine

## 2018-08-18 VITALS — BP 142/84 | HR 58 | Ht 67.0 in | Wt 163.0 lb

## 2018-08-18 DIAGNOSIS — I10 Essential (primary) hypertension: Secondary | ICD-10-CM | POA: Diagnosis not present

## 2018-08-18 DIAGNOSIS — Z950 Presence of cardiac pacemaker: Secondary | ICD-10-CM | POA: Diagnosis not present

## 2018-08-18 DIAGNOSIS — R001 Bradycardia, unspecified: Secondary | ICD-10-CM

## 2018-08-18 LAB — CUP PACEART INCLINIC DEVICE CHECK
Battery Remaining Longevity: 0 mo
Battery Voltage: 2.57 V
Brady Statistic RA Percent Paced: 88 %
Brady Statistic RV Percent Paced: 0.29 %
Implantable Lead Implant Date: 20110216
Implantable Lead Location: 753859
Implantable Lead Location: 753860
Implantable Pulse Generator Implant Date: 20100809
Lead Channel Impedance Value: 412.5 Ohm
Lead Channel Impedance Value: 562.5 Ohm
Lead Channel Pacing Threshold Amplitude: 0.5 V
Lead Channel Pacing Threshold Amplitude: 1 V
Lead Channel Pacing Threshold Pulse Width: 0.5 ms
Lead Channel Sensing Intrinsic Amplitude: 3.7 mV
Lead Channel Setting Pacing Amplitude: 2 V
Lead Channel Setting Pacing Amplitude: 2.5 V
Lead Channel Setting Pacing Pulse Width: 0.5 ms
Lead Channel Setting Sensing Sensitivity: 2 mV
MDC IDC LEAD IMPLANT DT: 20001030
MDC IDC MSMT LEADCHNL RA PACING THRESHOLD PULSEWIDTH: 0.5 ms
MDC IDC MSMT LEADCHNL RV SENSING INTR AMPL: 8.8 mV
MDC IDC SESS DTM: 20200305190957
Pulse Gen Model: 2110
Pulse Gen Serial Number: 2296554

## 2018-08-18 NOTE — Progress Notes (Signed)
HPI Sheila Murray returns today after an absence from our arrhythmia clinic. She is a pleasant 78 yo woman with a h/o symptomatic sinus node dysfunction, s/p PPM insertion, chronic tobacco abuse in remission, COPD, and prior surgery on her shoulder. She notes that when she stopped smoking in August, her sob resolved. She denies any anginal symptoms when she walks. No peripheral edema. No syncope.  Allergies  Allergen Reactions  . Codeine Nausea Only     Current Outpatient Medications  Medication Sig Dispense Refill  . buPROPion (WELLBUTRIN XL) 150 MG 24 hr tablet Take 1 tablet by mouth daily.    . calcium-vitamin D (OSCAL WITH D) 500-200 MG-UNIT per tablet Take 1 tablet by mouth daily.      Marland Kitchen escitalopram (LEXAPRO) 10 MG tablet Take 10 mg by mouth daily.    Marland Kitchen isometheptene-acetaminophen-dichloralphenazone (MIDRIN) 65-100-325 MG capsule Take 1 capsule by mouth 4 (four) times daily as needed for migraine. Maximum 5 capsules in 12 hours for migraine headaches, 8 capsules in 24 hours for tension headaches.    . levothyroxine (SYNTHROID, LEVOTHROID) 125 MCG tablet Take 125 mcg by mouth daily before breakfast.    . levothyroxine (SYNTHROID, LEVOTHROID) 200 MCG tablet Take 200 mcg by mouth daily before breakfast.    . lisinopril (PRINIVIL,ZESTRIL) 10 MG tablet Take 10 mg by mouth daily.     Marland Kitchen LORazepam (ATIVAN) 0.5 MG tablet Take 0.5 mg by mouth as needed for sleep.     . VENTOLIN HFA 108 (90 BASE) MCG/ACT inhaler Inhale 1-2 puffs into the lungs every 4 (four) hours as needed.      No current facility-administered medications for this visit.      Past Medical History:  Diagnosis Date  . COPD (chronic obstructive pulmonary disease) ([place])   . Diverticulosis of colon   . GERD (gastroesophageal reflux disease)   . HTN (hypertension)   . Hypothyroidism   . Other specified cardiac dysrhythmias(427.89)   . Sleep apnea     ROS:   All systems reviewed and negative except as noted in the  HPI.   Past Surgical History:  Procedure Laterality Date  . CARDIAC CATHETERIZATION N/A 02/08/2015   Procedure: Left Heart Cath and Coronary Angiography;  Surgeon: Burnell Blanks, MD;  Location: [facility];  Service: Cardiovascular;  Laterality: N/A;  . eyelid surgery    . PACEMAKER INSERTION  2000   St Jude  . STOMACH SURGERY    . VAGINAL HYSTERECTOMY       Family History  Problem Relation Age of Onset  . Prostate cancer Father   . Angina Mother   . Diabetes Mother   . Dementia Mother   . Healthy Brother      Social History   Socioeconomic History  . Marital status: Married    Spouse name: Not on file  . Number of children: Not on file  . Years of education: Not on file  . Highest education level: Not on file  Occupational History  . Not on file  Social Needs  . Financial resource strain: Not on file  . Food insecurity:    Worry: Not on file    Inability: Not on file  . Transportation needs:    Medical: Not on file    Non-medical: Not on file  Tobacco Use  . Smoking status: Former Smoker    Types: Cigarettes    Last attempt to quit: 06/16/2011    Years since quitting: 7.1  .  Smokeless tobacco: Never Used  Substance and Sexual Activity  . Alcohol use: No  . Drug use: Not on file  . Sexual activity: Not on file  Lifestyle  . Physical activity:    Days per week: Not on file    Minutes per session: Not on file  . Stress: Not on file  Relationships  . Social connections:    Talks on phone: Not on file    Gets together: Not on file    Attends religious service: Not on file    Active member of club or organization: Not on file    Attends meetings of clubs or organizations: Not on file    Relationship status: Not on file  . Intimate partner violence:    Fear of current or ex partner: Not on file    Emotionally abused: Not on file    Physically abused: Not on file    Forced sexual activity: Not on file  Other Topics Concern  . Not on file    Social History Narrative  . Not on file     BP (!) 142/84   Pulse (!) 58   Ht 5\' 7"  (1.702 m)   Wt 163 lb (73.9 kg)   SpO2 98%   BMI 25.53 kg/m   Physical Exam:  Well appearing NAD HEENT: Unremarkable Neck:  No JVD, no thyromegally Lymphatics:  No adenopathy Back:  No CVA tenderness Lungs:  Clear with no wheezes HEART:  Regular rate rhythm, no murmurs, no rubs, no clicks Abd:  soft, positive bowel sounds, no organomegally, no rebound, no guarding Ext:  2 plus pulses, no edema, no cyanosis, no clubbing Skin:  No rashes no nodules Neuro:  CN II through XII intact, motor grossly intact  EKG - nsr with PAC's  DEVICE  Normal device function.  See PaceArt for details.   Assess/Plan: 1. Sinus node dysfunction - she is asymptomatic, s/p PPM insertion. 2. PPM - her St. Jude DDD PM is working normally.She has reached ERI. She will undergo gen change out.  3. Tobacco abuse - no relapse. She is encouraged not to smoke.  Mikle Bosworth.D.

## 2018-08-18 NOTE — Patient Instructions (Addendum)
Medication Instructions:  Your physician recommends that you continue on your current medications as directed. Please refer to the Current Medication list given to you today.  Labwork: You will get lab work today:  BMP and CBC.  Testing/Procedures: Your physician has recommended that you have your pacemaker generator replaced.  Follow-Up: You will follow up with device clinic 10-14 days after your procedure for a wound check.  You will follow up with Dr. Lovena Le 91 days after your procedure.    GENERATOR CHANGE INSTRUCTIONS:  Please arrive TO ADMITTING down the hall from the [place] main entrance A of [facility] at:  7:30 am on September 12, 2018  Use the CHG surgical scrub as directed  Do not eat or drink after midnight prior to procedure  On the morning of the procedure you may take your normal morning medications with a sip of water  You will be discharged after your procedure.  You will need someone to drive you home at discharge  If you need a refill on your cardiac medications before your next appointment, please call your pharmacy.    Pacemaker Battery Change  A pacemaker battery usually lasts 5-15 years (6-7 years on average). A few times a year, you will be asked to visit your health care provider to have a full evaluation of your pacemaker. When the battery is low, your pacemaker battery and generator will be completely replaced. Most often, this procedure is simpler than the first surgery because the wires (leads) that connect the generator to the heart are already in place. There are many things that affect how long a pacemaker battery will last, including:  The age of the pacemaker.  The number of leads you have(1, 2, or 3).  The pacemaker workload. If the pacemaker is helping the heart more often, the battery will not last as long.  Power (voltage) settings. Tell a health care provider about:  Any allergies you have.  All medicines you are taking,  including vitamins, herbs, eye drops, creams, and over-the-counter medicines.  Any problems you or family members have had with anesthetic medicines.  Any blood disorders you have.  Any surgeries you have had, especially the surgeries you have had since your last pacemaker was placed.  Any medical conditions you have.  Whether you are pregnant or may be pregnant.  Any symptoms of heart problems, such as chest pain, trouble breathing, palpitations, light-headedness, or feelings of an abnormal or irregular heartbeat.  Smoking habits. This can affect your reaction to anesthesia. What are the risks? Generally, this is a safe procedure. However, problems may occur, including:  Bleeding.  Bruising of the skin around where the surgical cut (incision) was made.  Pulling apart of the skin at the incision site.  Infection.  Nerve damage.  Injury to other organs, such as the lungs.  Allergic reaction to anesthetics or other medicines used during the procedure.  People with diabetes may have a temporary increase in blood sugar (glucose) after any surgical procedure. What happens before the procedure? Staying hydrated Follow instructions from your health care provider about hydration, which may include:  Up to 2 hours before the procedure - you may continue to drink clear liquids, such as water, clear fruit juice, black coffee, and plain tea. Eating and drinking restrictions Follow instructions from your health care provider about eating and drinking restrictions, which may include:  8 hours before the procedure - stop eating heavy meals or foods such as meat, fried foods,  or fatty foods.  6 hours before the procedure - stop eating light meals or foods, such as toast or cereal.  6 hours before the procedure - stop drinking milk or drinks that contain milk.  2 hours before the procedure - stop drinking clear liquids. General instructions  Ask your health care provider  about: ? Changing or stopping your regular medicines. This is especially important if you are taking diabetes medicines or blood thinners. ? Taking medicines such as aspirin and ibuprofen. These medicines can thin your blood. Do not take these medicines before your procedure if your health care provider instructs you not to. ? Taking a sip of water with any approved medicines on the morning of the procedure.  Plan to have someone take you home after the procedure. What happens during the procedure?  To reduce your risk of infection: ? Your health care team will wash or sanitize their hands. ? The skin around the area of the chest will be washed with soap. ? Hair may be removed from the surgical area.  An IV tube will be inserted into one your veins to give you medicine and fluids.  You will be given one or more of the following: ? A medicine to help you relax (sedative). ? A medicine to numb the area where the pacemaker is located (local anesthetic).  You may be given antibiotic medicine to prevent infection.  Your health care provider will make an incision to reopen the pocket holding the pacemaker.  The old pacemaker will be disconnected from the leads.  The leads will be tested.  If needed, the leads will be replaced. If the leads are functioning properly, the new pacemaker will be connected to the existing leads.  A heart monitor and the pacemaker programmer will be used to make sure that the newly implanted pacemaker is working properly.  The incision site will be closed. A bandage (dressing) will be placed over the pacemaker site. The procedure may vary among health care providers and hospitals. What happens after the procedure?  Your blood pressure, heart rate, breathing rate, and blood oxygen level will be monitored until your health care team is satisfied that your pacemaker is working properly.  Your health care provider will tell you when your pacemaker will need to be  tested again, or when to return to the office for removal of dressing and stitches.  Do not drive for 24 hours if you were given a sedative.  The dressing will be removed 24-48 hours after the procedure, or as told by your health care provider. Summary  A pacemaker battery usually lasts 5-15 years (6-7 years on average).  When the battery is low, your pacemaker battery and generator will be completely replaced.  Risks of this procedure include bleeding, bruising, infection, damage to other structures, pulling apart of the skin at the incision site, and allergic reactions to medicines or anesthetics.  Most often, this procedure is simpler than the first surgery because the wires (leads) that connect the generator to the heart are already in place. This information is not intended to replace advice given to you by your health care provider. Make sure you discuss any questions you have with your health care provider. Document Released: 09/09/2006 Document Revised: 04/29/2017 Document Reviewed: 05/05/2016 Elsevier Interactive Patient Education  2019 [employer].

## 2018-08-19 LAB — CBC WITH DIFFERENTIAL/PLATELET
Basophils Absolute: 0.1 10*3/uL (ref 0.0–0.2)
Basos: 1 %
EOS (ABSOLUTE): 0.3 10*3/uL (ref 0.0–0.4)
Eos: 3 %
Hematocrit: 38.6 % (ref 34.0–46.6)
Hemoglobin: 12.8 g/dL (ref 11.1–15.9)
Immature Grans (Abs): 0 10*3/uL (ref 0.0–0.1)
Immature Granulocytes: 0 %
Lymphocytes Absolute: 3.3 10*3/uL — ABNORMAL HIGH (ref 0.7–3.1)
Lymphs: 38 %
MCH: 31.4 pg (ref 26.6–33.0)
MCHC: 33.2 g/dL (ref 31.5–35.7)
MCV: 95 fL (ref 79–97)
Monocytes Absolute: 0.8 10*3/uL (ref 0.1–0.9)
Monocytes: 9 %
Neutrophils Absolute: 4.2 10*3/uL (ref 1.4–7.0)
Neutrophils: 49 %
Platelets: 249 10*3/uL (ref 150–450)
RBC: 4.07 x10E6/uL (ref 3.77–5.28)
RDW: 12.3 % (ref 11.7–15.4)
WBC: 8.6 10*3/uL (ref 3.4–10.8)

## 2018-08-19 LAB — BASIC METABOLIC PANEL WITH GFR
BUN/Creatinine Ratio: 17 (ref 12–28)
BUN: 16 mg/dL (ref 8–27)
CO2: 22 mmol/L (ref 20–29)
Calcium: 9.4 mg/dL (ref 8.7–10.3)
Chloride: 104 mmol/L (ref 96–106)
Creatinine, Ser: 0.92 mg/dL (ref 0.57–1.00)
GFR calc Af Amer: 69 mL/min/{1.73_m2}
GFR calc non Af Amer: 60 mL/min/{1.73_m2}
Glucose: 90 mg/dL (ref 65–99)
Potassium: 4.7 mmol/L (ref 3.5–5.2)
Sodium: 140 mmol/L (ref 134–144)

## 2018-08-23 NOTE — Addendum Note (Signed)
Addended by: Dollene Primrose on: 08/23/2018 12:29 PM   Modules accepted: Orders

## 2018-08-25 DIAGNOSIS — M545 Low back pain: Secondary | ICD-10-CM | POA: Diagnosis not present

## 2018-08-25 DIAGNOSIS — M5416 Radiculopathy, lumbar region: Secondary | ICD-10-CM | POA: Diagnosis not present

## 2018-08-31 ENCOUNTER — Telehealth: Payer: Self-pay | Admitting: *Deleted

## 2018-08-31 NOTE — Telephone Encounter (Signed)
Called patient to inform her due to [place] we going to have to reschedule her PPM Generator Change Out to a later date.  Yes verbally agreed this was a great option due to recently finding out she needs to have shots in her back first.  She is verbally agreed to reschedule her March 30th procedure to April 27th.  She is to arrive no later than 5:30am.  She will call if she has any questions between now and then.  She would like a remind call of her procedure and instructions closer to the procedure date.

## 2018-09-05 ENCOUNTER — Telehealth: Payer: Self-pay | Admitting: Internal Medicine

## 2018-09-05 NOTE — Telephone Encounter (Signed)
Pt wants to know if she sends in a home remote transmission for her pacemaker, the pain she is experiencing can be evaluated

## 2018-09-06 NOTE — Telephone Encounter (Signed)
Transmission received. PPM at [facility] as of 07/21/18 (known, pending generator replacement). Normal device function. No atrial or ventricular high rate episodes noted. Presenting rhythm shows Ap/Vs.

## 2018-09-06 NOTE — Telephone Encounter (Signed)
[facility] requesting call back, gave main office number for return call.  Last remote transmission received on 06/27/18.

## 2018-09-06 NOTE — Telephone Encounter (Signed)
Call placed to Pt.  Pt states she has some pain left side, near pacemaker.  Remote sent.  Will cont to monitor.

## 2018-09-08 NOTE — Telephone Encounter (Signed)
Left message for Pt.  Advised Pt recent remote transmission was normal.  Asked Pt to return this nurse call if she is still have pain near her pacemaker.

## 2018-09-08 NOTE — Telephone Encounter (Signed)
Left detailed message for Pt.  Advised it may be stress.  Pt does have history of CAD.  Advised if just slight pain, not radiating, no sob, no nausea that it may be ok, but if she develops any of these symptoms she should call office immediately.   Sent mychart sign up.

## 2018-09-08 NOTE — Telephone Encounter (Signed)
Follow Up:    Pt called back and said she is having a little pain, she thinks it might be stress.

## 2018-09-23 ENCOUNTER — Ambulatory Visit: Payer: Medicare HMO

## 2018-10-04 ENCOUNTER — Telehealth: Payer: Self-pay

## 2018-10-04 NOTE — Telephone Encounter (Signed)
Outreach made to Pt.  No answer.  Will call back later.

## 2018-10-06 NOTE — Telephone Encounter (Signed)
F/U Message           Patient is returning a call and would like a call back now if possible.

## 2018-10-06 NOTE — Telephone Encounter (Signed)
Attempted to call pt back, no answer and no VM set up to LM.

## 2018-10-07 ENCOUNTER — Telehealth: Payer: Self-pay | Admitting: Internal Medicine

## 2018-10-07 NOTE — Telephone Encounter (Signed)
New Message     Pt will be home until 12pm and needs Dr Forde Dandy nurse to call her    Please call

## 2018-10-07 NOTE — Telephone Encounter (Signed)
Returned call to Pt.  Went over pre procedure instructions.  All questions answered.

## 2018-10-10 ENCOUNTER — Other Ambulatory Visit: Payer: Self-pay

## 2018-10-10 ENCOUNTER — Ambulatory Visit (HOSPITAL_COMMUNITY)
Admission: RE | Disposition: A | Discharge status: Home / Self Care | Payer: Self-pay | Source: Home / Self Care | Attending: Internal Medicine

## 2018-10-10 ENCOUNTER — Ambulatory Visit (HOSPITAL_COMMUNITY)
Admission: RE | Admit: 2018-10-10 | Discharge: 2018-10-10 | Disposition: A | Discharge status: Home / Self Care | Payer: Medicare HMO | Attending: Internal Medicine | Admitting: Internal Medicine

## 2018-10-10 DIAGNOSIS — I10 Essential (primary) hypertension: Secondary | ICD-10-CM | POA: Insufficient documentation

## 2018-10-10 DIAGNOSIS — I495 Sick sinus syndrome: Secondary | ICD-10-CM

## 2018-10-10 DIAGNOSIS — E039 Hypothyroidism, unspecified: Secondary | ICD-10-CM | POA: Diagnosis not present

## 2018-10-10 DIAGNOSIS — Z87891 Personal history of nicotine dependence: Secondary | ICD-10-CM | POA: Insufficient documentation

## 2018-10-10 DIAGNOSIS — Z90710 Acquired absence of both cervix and uterus: Secondary | ICD-10-CM | POA: Insufficient documentation

## 2018-10-10 DIAGNOSIS — K219 Gastro-esophageal reflux disease without esophagitis: Secondary | ICD-10-CM | POA: Insufficient documentation

## 2018-10-10 DIAGNOSIS — Z885 Allergy status to narcotic agent status: Secondary | ICD-10-CM | POA: Insufficient documentation

## 2018-10-10 DIAGNOSIS — Z79890 Hormone replacement therapy (postmenopausal): Secondary | ICD-10-CM | POA: Insufficient documentation

## 2018-10-10 DIAGNOSIS — Z79899 Other long term (current) drug therapy: Secondary | ICD-10-CM | POA: Diagnosis not present

## 2018-10-10 DIAGNOSIS — I498 Other specified cardiac arrhythmias: Secondary | ICD-10-CM | POA: Diagnosis not present

## 2018-10-10 DIAGNOSIS — J449 Chronic obstructive pulmonary disease, unspecified: Secondary | ICD-10-CM | POA: Diagnosis not present

## 2018-10-10 DIAGNOSIS — Z45010 Encounter for checking and testing of cardiac pacemaker pulse generator [battery]: Secondary | ICD-10-CM | POA: Insufficient documentation

## 2018-10-10 DIAGNOSIS — G4730 Sleep apnea, unspecified: Secondary | ICD-10-CM | POA: Diagnosis not present

## 2018-10-10 HISTORY — PX: PPM GENERATOR CHANGEOUT: EP1233

## 2018-10-10 LAB — BASIC METABOLIC PANEL
Anion gap: 7 (ref 5–15)
BUN: 15 mg/dL (ref 8–23)
CO2: 26 mmol/L (ref 22–32)
Calcium: 8.8 mg/dL — ABNORMAL LOW (ref 8.9–10.3)
Chloride: 107 mmol/L (ref 98–111)
Creatinine, Ser: 1.14 mg/dL — ABNORMAL HIGH (ref 0.44–1.00)
GFR calc Af Amer: 54 mL/min — ABNORMAL LOW (ref >60)
GFR calc non Af Amer: 46 mL/min — ABNORMAL LOW (ref >60)
Glucose, Bld: 100 mg/dL — ABNORMAL HIGH (ref 70–99)
Potassium: 3.9 mmol/L (ref 3.5–5.1)
Sodium: 140 mmol/L (ref 135–145)

## 2018-10-10 LAB — CBC
HCT: 35.9 % — ABNORMAL LOW (ref 36.0–46.0)
Hemoglobin: 11.8 g/dL — ABNORMAL LOW (ref 12.0–15.0)
MCH: 31.6 pg (ref 26.0–34.0)
MCHC: 32.9 g/dL (ref 30.0–36.0)
MCV: 96.2 fL (ref 80.0–100.0)
Platelets: 199 10*3/uL (ref 150–400)
RBC: 3.73 MIL/uL — ABNORMAL LOW (ref 3.87–5.11)
RDW: 12.7 % (ref 11.5–15.5)
WBC: 7.6 10*3/uL (ref 4.0–10.5)
nRBC: 0 % (ref 0.0–0.2)

## 2018-10-10 LAB — SURGICAL PCR SCREEN
MRSA, PCR: NEGATIVE
Staphylococcus aureus: NEGATIVE

## 2018-10-10 SURGERY — PPM GENERATOR CHANGEOUT

## 2018-10-10 MED ORDER — FENTANYL CITRATE (PF) 100 MCG/2ML IJ SOLN
INTRAMUSCULAR | Status: AC
  Filled 2018-10-10: qty 2

## 2018-10-10 MED ORDER — FENTANYL CITRATE (PF) 100 MCG/2ML IJ SOLN
INTRAMUSCULAR | Status: DC | PRN
  Administered 2018-10-10: 12.5 ug via INTRAVENOUS

## 2018-10-10 MED ORDER — LIDOCAINE HCL 1 % IJ SOLN
INTRAMUSCULAR | Status: AC
  Filled 2018-10-10: qty 60

## 2018-10-10 MED ORDER — SODIUM CHLORIDE 0.9 % IV SOLN
80.0000 mg | INTRAVENOUS | Status: AC
  Administered 2018-10-10: 80 mg
  Filled 2018-10-10: qty 2

## 2018-10-10 MED ORDER — MUPIROCIN 2 % EX OINT
TOPICAL_OINTMENT | CUTANEOUS | Status: AC
  Administered 2018-10-10: 06:00:00
  Filled 2018-10-10: qty 22

## 2018-10-10 MED ORDER — ACETAMINOPHEN 325 MG PO TABS: 325.0000 mg | ORAL_TABLET | ORAL | Status: DC | PRN

## 2018-10-10 MED ORDER — CEFAZOLIN SODIUM-DEXTROSE 2-4 GM/100ML-% IV SOLN
INTRAVENOUS | Status: AC
  Filled 2018-10-10: qty 100

## 2018-10-10 MED ORDER — SODIUM CHLORIDE 0.9 % IV SOLN
INTRAVENOUS | Status: AC
  Filled 2018-10-10: qty 2

## 2018-10-10 MED ORDER — MIDAZOLAM HCL 5 MG/5ML IJ SOLN
INTRAMUSCULAR | Status: AC
  Filled 2018-10-10: qty 5

## 2018-10-10 MED ORDER — ONDANSETRON HCL 4 MG/2ML IJ SOLN: 4.0000 mg | Freq: Four times a day (QID) | INTRAMUSCULAR | Status: DC | PRN

## 2018-10-10 MED ORDER — CEFAZOLIN SODIUM-DEXTROSE 2-4 GM/100ML-% IV SOLN
2.0000 g | INTRAVENOUS | Status: AC
  Administered 2018-10-10: 2 g via INTRAVENOUS
  Filled 2018-10-10: qty 100

## 2018-10-10 MED ORDER — SODIUM CHLORIDE 0.9 % IV SOLN
INTRAVENOUS | Status: DC
  Administered 2018-10-10: 06:00:00 via INTRAVENOUS

## 2018-10-10 MED ORDER — CHLORHEXIDINE GLUCONATE 4 % EX LIQD: 60.0000 mL | Freq: Once | CUTANEOUS | Status: DC

## 2018-10-10 MED ORDER — LIDOCAINE HCL (PF) 1 % IJ SOLN
INTRAMUSCULAR | Status: DC | PRN
  Administered 2018-10-10: 60 mL

## 2018-10-10 MED ORDER — MIDAZOLAM HCL 5 MG/5ML IJ SOLN
INTRAMUSCULAR | Status: DC | PRN
  Administered 2018-10-10: 1 mg via INTRAVENOUS

## 2018-10-10 SURGICAL SUPPLY — 4 items
CABLE SURGICAL S-101-97-12 (CABLE) ×2 IMPLANT
PACEMAKER ASSURITY DR-RF (Pacemaker) ×2 IMPLANT
PAD PRO RADIOLUCENT 2001M-C (PAD) ×2 IMPLANT
TRAY PACEMAKER INSERTION (PACKS) ×2 IMPLANT

## 2018-10-10 NOTE — Discharge Instructions (Signed)
Procedure site/wound care insructions Keep incision clean and dry for 10 days. No driving for 2 days.  You can remove outer dressing tomorrow. Leave steri-strips (little pieces of tape) on until seen in the office for wound check appointment. Call the office [phone]) for redness, drainage, swelling, or fever.   Pacemaker Battery Change, Care After This sheet gives you information about how to care for yourself after your procedure. Your health care provider may also give you more specific instructions. If you have problems or questions, contact your health care provider. What can I expect after the procedure? After your procedure, it is common to have:  Pain or soreness at the site where the pacemaker was inserted.  Swelling at the site where the pacemaker was inserted. Follow these instructions at home: Incision care   Keep the incision clean and dry. ? Do not take baths, swim, or use a hot tub until your health care provider approves. ? You may shower the day after your procedure, or as directed by your health care provider. ? Pat the area dry with a clean towel. Do not rub the area. This may cause bleeding.  Follow instructions from your health care provider about how to take care of your incision. Make sure you: ? Wash your hands with soap and water before you change your bandage (dressing). If soap and water are not available, use hand sanitizer. ? Change your dressing as told by your health care provider. ? Leave stitches (sutures), skin glue, or adhesive strips in place. These skin closures may need to stay in place for 2 weeks or longer. If adhesive strip edges start to loosen and curl up, you may trim the loose edges. Do not remove adhesive strips completely unless your health care provider tells you to do that.  Check your incision area every day for signs of infection. Check for: ? More redness, swelling, or pain. ? More fluid or blood. ? Warmth. ? Pus or a bad  smell. Activity  Do not lift anything that is heavier than 10 lb (4.5 kg) until your health care provider says it is okay to do so.  For the first 2 weeks, or as long as told by your health care provider: ? Avoid lifting your left arm higher than your shoulder. ? Be gentle when you move your arms over your head. It is okay to raise your arm to comb your hair. ? Avoid strenuous exercise.  Ask your health care provider when it is okay to: ? Resume your normal activities. ? Return to work or school. ? Resume sexual activity. Eating and drinking  Eat a heart-healthy diet. This should include plenty of fresh fruits and vegetables, whole grains, low-fat dairy products, and lean protein like chicken and fish.  Limit alcohol intake to no more than 1 drink a day for non-pregnant women and 2 drinks a day for men. One drink equals 12 oz of beer, 5 oz of wine, or 1 oz of hard liquor.  Check ingredients and nutrition facts on packaged foods and beverages. Avoid the following types of food: ? Food that is high in salt (sodium). ? Food that is high in saturated fat, like full-fat dairy or red meat. ? Food that is high in trans fat, like fried food. ? Food and drinks that are high in sugar. Lifestyle  Do not use any products that contain nicotine or tobacco, such as cigarettes and e-cigarettes. If you need help quitting, ask your health care provider.  Take steps to manage and control your weight.  Get regular exercise. Aim for 150 minutes of moderate-intensity exercise (such as walking or yoga) or 75 minutes of vigorous exercise (such as running or swimming) each week.  Manage other health problems, such as diabetes or high blood pressure. Ask your health care provider how you can manage these conditions. General instructions  Do not drive for 24 hours after your procedure if you were given a medicine to help you relax (sedative).  Take over-the-counter and prescription medicines only as told  by your health care provider.  Avoid putting pressure on the area where the pacemaker was placed.  If you need an MRI after your pacemaker has been placed, be sure to tell the health care provider who orders the MRI that you have a pacemaker.  Avoid close and prolonged exposure to electrical devices that have strong magnetic fields. These include: ? Cell phones. Avoid keeping them in a pocket near the pacemaker, and try using the ear opposite the pacemaker. ? MP3 players. ? Household appliances, like microwaves. ? Metal detectors. ? Electric generators. ? High-tension wires.  Keep all follow-up visits as directed by your health care provider. This is important. Contact a health care provider if:  You have pain at the incision site that is not relieved by over-the-counter or prescription medicines.  You have any of these around your incision site or coming from it: ? More redness, swelling, or pain. ? Fluid or blood. ? Warmth to the touch. ? Pus or a bad smell.  You have a fever.  You feel brief, occasional palpitations, light-headedness, or any symptoms that you think might be related to your heart. Get help right away if:  You experience chest pain that is different from the pain at the pacemaker site.  You develop a red streak that extends above or below the incision site.  You experience shortness of breath.  You have palpitations or an irregular heartbeat.  You have light-headedness that does not go away quickly.  You faint or have dizzy spells.  Your pulse suddenly drops or increases rapidly and does not return to normal.  You begin to gain weight and your legs and ankles swell. Summary  After your procedure, it is common to have pain, soreness, and some swelling where the pacemaker was inserted.  Make sure to keep your incision clean and dry. Follow instructions from your health care provider about how to take care of your incision.  Check your incision every  day for signs of infection, such as more pain or swelling, pus or a bad smell, warmth, or leaking fluid and blood.  Avoid strenuous exercise and lifting your left arm higher than your shoulder for 2 weeks, or as long as told by your health care provider. This information is not intended to replace advice given to you by your health care provider. Make sure you discuss any questions you have with your health care provider. Document Released: 03/22/2013 Document Revised: 04/23/2016 Document Reviewed: 04/23/2016 Elsevier Interactive Patient Education  2019 [employer].

## 2018-10-10 NOTE — H&P (Signed)
HPI Mrs. Roberti returns today after an absence from our arrhythmia clinic. She is a pleasant 78 yo woman with a h/o symptomatic sinus node dysfunction, s/p PPM insertion, chronic tobacco abuse in remission, COPD, and prior surgery on her shoulder. She notes that when she stopped smoking in August, her sob resolved. She denies any anginal symptoms when she walks. No peripheral edema. No syncope.     Allergies  Allergen Reactions  . Codeine Nausea Only           Current Outpatient Medications  Medication Sig Dispense Refill  . buPROPion (WELLBUTRIN XL) 150 MG 24 hr tablet Take 1 tablet by mouth daily.    . calcium-vitamin D (OSCAL WITH D) 500-200 MG-UNIT per tablet Take 1 tablet by mouth daily.      Marland Kitchen escitalopram (LEXAPRO) 10 MG tablet Take 10 mg by mouth daily.    Marland Kitchen isometheptene-acetaminophen-dichloralphenazone (MIDRIN) 65-100-325 MG capsule Take 1 capsule by mouth 4 (four) times daily as needed for migraine. Maximum 5 capsules in 12 hours for migraine headaches, 8 capsules in 24 hours for tension headaches.    . levothyroxine (SYNTHROID, LEVOTHROID) 125 MCG tablet Take 125 mcg by mouth daily before breakfast.    . levothyroxine (SYNTHROID, LEVOTHROID) 200 MCG tablet Take 200 mcg by mouth daily before breakfast.    . lisinopril (PRINIVIL,ZESTRIL) 10 MG tablet Take 10 mg by mouth daily.     Marland Kitchen LORazepam (ATIVAN) 0.5 MG tablet Take 0.5 mg by mouth as needed for sleep.     . VENTOLIN HFA 108 (90 BASE) MCG/ACT inhaler Inhale 1-2 puffs into the lungs every 4 (four) hours as needed.      No current facility-administered medications for this visit.          Past Medical History:  Diagnosis Date  . COPD (chronic obstructive pulmonary disease) ([place])   . Diverticulosis of colon   . GERD (gastroesophageal reflux disease)   . HTN (hypertension)   . Hypothyroidism   . Other specified cardiac dysrhythmias(427.89)   . Sleep apnea     ROS:   All  systems reviewed and negative except as noted in the HPI.        Past Surgical History:  Procedure Laterality Date  . CARDIAC CATHETERIZATION N/A 02/08/2015   Procedure: Left Heart Cath and Coronary Angiography;  Surgeon: Burnell Blanks, MD;  Location: [facility];  Service: Cardiovascular;  Laterality: N/A;  . eyelid surgery    . PACEMAKER INSERTION  2000   St Jude  . STOMACH SURGERY    . VAGINAL HYSTERECTOMY            Family History  Problem Relation Age of Onset  . Prostate cancer Father   . Angina Mother   . Diabetes Mother   . Dementia Mother   . Healthy Brother      Social History        Socioeconomic History  . Marital status: Married    Spouse name: Not on file  . Number of children: Not on file  . Years of education: Not on file  . Highest education level: Not on file  Occupational History  . Not on file  Social Needs  . Financial resource strain: Not on file  . Food insecurity:    Worry: Not on file    Inability: Not on file  . Transportation needs:    Medical: Not on file    Non-medical: Not on file  Tobacco Use  .  Smoking status: Former Smoker    Types: Cigarettes    Last attempt to quit: 06/16/2011    Years since quitting: 7.1  . Smokeless tobacco: Never Used  Substance and Sexual Activity  . Alcohol use: No  . Drug use: Not on file  . Sexual activity: Not on file  Lifestyle  . Physical activity:    Days per week: Not on file    Minutes per session: Not on file  . Stress: Not on file  Relationships  . Social connections:    Talks on phone: Not on file    Gets together: Not on file    Attends religious service: Not on file    Active member of club or organization: Not on file    Attends meetings of clubs or organizations: Not on file    Relationship status: Not on file  . Intimate partner violence:    Fear of current or ex partner: Not on file    Emotionally  abused: Not on file    Physically abused: Not on file    Forced sexual activity: Not on file  Other Topics Concern  . Not on file  Social History Narrative  . Not on file     BP (!) 142/84   Pulse (!) 58   Ht 5\' 7"  (1.702 m)   Wt 163 lb (73.9 kg)   SpO2 98%   BMI 25.53 kg/m   Physical Exam:  Well appearing NAD HEENT: Unremarkable Neck:  No JVD, no thyromegally Lymphatics:  No adenopathy Back:  No CVA tenderness Lungs:  Clear with no wheezes HEART:  Regular rate rhythm, no murmurs, no rubs, no clicks Abd:  soft, positive bowel sounds, no organomegally, no rebound, no guarding Ext:  2 plus pulses, no edema, no cyanosis, no clubbing Skin:  No rashes no nodules Neuro:  CN II through XII intact, motor grossly intact  EKG - nsr with PAC's  DEVICE  Normal device function.  See PaceArt for details.   Assess/Plan: 1. Sinus node dysfunction - she is asymptomatic, s/p PPM insertion. 2. PPM - her St. Jude DDD PM is working normally.She has reached ERI. She will undergo gen change out.  3. Tobacco abuse - no relapse. She is encouraged not to smoke.  Ponciano Ort.  EP Attending  Patient seen and examined. Agree with the findings as noted above. She will undergo PPM gen change.   Mikle Bosworth.D.

## 2018-10-10 NOTE — Progress Notes (Signed)
D/c instructions reviewed with son, Sheila Murray, via telephone due to [place] restrictions. All questions answered and Sheila Murray verbalized understanding

## 2018-10-11 ENCOUNTER — Encounter (HOSPITAL_COMMUNITY): Payer: Self-pay | Admitting: Internal Medicine

## 2018-10-11 MED FILL — Lidocaine HCl Local Inj 1%: INTRAMUSCULAR | Qty: 60 | Status: AC

## 2018-10-19 ENCOUNTER — Ambulatory Visit: Payer: Medicare HMO

## 2018-10-20 ENCOUNTER — Ambulatory Visit: Payer: Medicare HMO

## 2018-10-20 DIAGNOSIS — R05 Cough: Secondary | ICD-10-CM | POA: Diagnosis not present

## 2018-11-08 ENCOUNTER — Encounter

## 2018-11-08 ENCOUNTER — Ambulatory Visit: Payer: Medicare HMO

## 2018-11-24 DIAGNOSIS — H524 Presbyopia: Secondary | ICD-10-CM | POA: Diagnosis not present

## 2018-11-24 DIAGNOSIS — Z0100 Encounter for examination of eyes and vision without abnormal findings: Secondary | ICD-10-CM | POA: Diagnosis not present

## 2018-12-02 ENCOUNTER — Telehealth: Payer: Self-pay | Admitting: *Deleted

## 2018-12-02 NOTE — Telephone Encounter (Signed)
    [facility] Pre-operative Risk Assessment    Request for surgical clearance:  1. What type of surgery is being performed? Lumbar Epidural Steroid Injections  2. When is this surgery scheduled? TBD  3. What type of clearance is required (medical clearance vs. Pharmacy clearance to hold med vs. Both)? Both  4. Are there any medications that need to be held prior to surgery and how long? TBD  5. Practice name and name of physician performing surgery? [facility], Julien Girt. Cram   6. What is your office phone number [phone]   7.   What is your office fax number [phone]  8.   Anesthesia type (None, local, MAC, general) ? General   Ji Feldner 12/02/2018, 4:33 PM  _________________________________________________________________   (provider comments below)

## 2018-12-05 NOTE — Telephone Encounter (Signed)
   Primary Cardiologist: No primary care provider on file.  Chart reviewed as part of pre-operative protocol coverage. Patient was contacted 12/05/2018 in reference to pre-operative risk assessment for pending surgery as outlined below.  Sheila Murray was last seen on 08/18/18 by Dr. Lovena Le. She is followed by our office only for pacemaker management. She has no known history of CAD and is having no concerning symptoms. She had a pacemaker generator change out on 10/10/18 and has healed from that procedure.   Therefore, based on ACC/AHA guidelines, the patient would be at acceptable risk for the planned procedure without further cardiovascular testing.   I will route this recommendation to the requesting party via Epic fax function and remove from pre-op pool.  Please call with questions.  Daune Perch, NP 12/05/2018, 11:11 AM

## 2018-12-30 DIAGNOSIS — E039 Hypothyroidism, unspecified: Secondary | ICD-10-CM | POA: Diagnosis not present

## 2018-12-30 DIAGNOSIS — E559 Vitamin D deficiency, unspecified: Secondary | ICD-10-CM | POA: Diagnosis not present

## 2018-12-30 DIAGNOSIS — I10 Essential (primary) hypertension: Secondary | ICD-10-CM | POA: Diagnosis not present

## 2018-12-30 DIAGNOSIS — R69 Illness, unspecified: Secondary | ICD-10-CM | POA: Diagnosis not present

## 2019-01-09 ENCOUNTER — Telehealth: Payer: Self-pay | Admitting: Internal Medicine

## 2019-01-09 NOTE — Telephone Encounter (Signed)

## 2019-01-10 ENCOUNTER — Encounter: Payer: Self-pay | Admitting: Internal Medicine

## 2019-01-10 ENCOUNTER — Ambulatory Visit: Payer: Medicare HMO | Admitting: Internal Medicine

## 2019-01-10 ENCOUNTER — Encounter: Payer: Medicare HMO | Admitting: *Deleted

## 2019-01-10 ENCOUNTER — Other Ambulatory Visit: Payer: Self-pay

## 2019-01-10 VITALS — BP 152/90 | HR 63 | Ht 67.0 in | Wt 168.0 lb

## 2019-01-10 DIAGNOSIS — I10 Essential (primary) hypertension: Secondary | ICD-10-CM

## 2019-01-10 DIAGNOSIS — Z950 Presence of cardiac pacemaker: Secondary | ICD-10-CM

## 2019-01-10 DIAGNOSIS — I495 Sick sinus syndrome: Secondary | ICD-10-CM | POA: Diagnosis not present

## 2019-01-10 NOTE — Patient Instructions (Signed)
Medication Instructions:  Your physician recommends that you continue on your current medications as directed. Please refer to the Current Medication list given to you today.  Labwork: None ordered.  Testing/Procedures: None ordered.  Follow-Up: Your physician wants you to follow-up in: 1 year with Dr. Lovena Le.   You will receive a reminder letter in the mail two months in advance. If you don't receive a letter, please call our office to schedule the follow-up appointment.  Remote monitoring is used to monitor your Pacemaker from home. This monitoring reduces the number of office visits required to check your device to one time per year. It allows [place] to keep an eye on the functioning of your device to ensure it is working properly. You are scheduled for a device check from home on 04/10/2019. You may send your transmission at any time that day. If you have a wireless device, the transmission will be sent automatically. After your physician reviews your transmission, you will receive a postcard with your next transmission date.  Any Other Special Instructions Will Be Listed Below (If Applicable).  If you need a refill on your cardiac medications before your next appointment, please call your pharmacy.

## 2019-01-10 NOTE — Progress Notes (Signed)
HPI Mrs. Sheila Murray returns today for followup of sinus node dysfunction, s/p PPM insertion, chronic tobacco abuse in remission, and arthritis. She has been bothered by problems with her lower back and is pending spinal injection. Allergies  Allergen Reactions  . Codeine Nausea Only     Current Outpatient Medications  Medication Sig Dispense Refill  . baclofen (LIORESAL) 10 MG tablet Take 10 mg by mouth daily as needed for muscle spasms.    Marland Kitchen buPROPion (WELLBUTRIN XL) 150 MG 24 hr tablet Take 150 mg by mouth daily.     Marland Kitchen escitalopram (LEXAPRO) 20 MG tablet Take 20 mg by mouth daily.     . hydroxypropyl methylcellulose / hypromellose (ISOPTO TEARS / GONIOVISC) 2.5 % ophthalmic solution 1 drop 3 (three) times daily as needed for dry eyes.    Marland Kitchen levothyroxine (SYNTHROID, LEVOTHROID) 125 MCG tablet Take 125 mcg by mouth daily before breakfast.    . lisinopril (PRINIVIL,ZESTRIL) 20 MG tablet Take 20 mg by mouth daily.     Marland Kitchen LORazepam (ATIVAN) 1 MG tablet Take 1 mg by mouth at bedtime.     . Multiple Vitamin (MULTIVITAMIN WITH MINERALS) TABS tablet Take 1 tablet by mouth daily.    . traMADol (ULTRAM) 50 MG tablet Take 50 mg by mouth every 6 (six) hours as needed for moderate pain.    . VENTOLIN HFA 108 (90 BASE) MCG/ACT inhaler Inhale 1-2 puffs into the lungs every 4 (four) hours as needed for wheezing or shortness of breath.      No current facility-administered medications for this visit.      Past Medical History:  Diagnosis Date  . COPD (chronic obstructive pulmonary disease) ([place])   . Diverticulosis of colon   . GERD (gastroesophageal reflux disease)   . HTN (hypertension)   . Hypothyroidism   . Other specified cardiac dysrhythmias(427.89)   . Sleep apnea     ROS:   All systems reviewed and negative except as noted in the HPI.   Past Surgical History:  Procedure Laterality Date  . CARDIAC CATHETERIZATION N/A 02/08/2015   Procedure: Left Heart Cath and Coronary  Angiography;  Surgeon: Burnell Blanks, MD;  Location: [facility];  Service: Cardiovascular;  Laterality: N/A;  . eyelid surgery    . PACEMAKER INSERTION  2000   St Jude  . PPM GENERATOR CHANGEOUT N/A 10/10/2018   Procedure: PPM GENERATOR CHANGEOUT;  Surgeon: Evans Lance, MD;  Location: [facility];  Service: Cardiovascular;  Laterality: N/A;  . STOMACH SURGERY    . VAGINAL HYSTERECTOMY       Family History  Problem Relation Age of Onset  . Prostate cancer Father   . Angina Mother   . Diabetes Mother   . Dementia Mother   . Healthy Brother      Social History   Socioeconomic History  . Marital status: Married    Spouse name: Not on file  . Number of children: Not on file  . Years of education: Not on file  . Highest education level: Not on file  Occupational History  . Not on file  Social Needs  . Financial resource strain: Not on file  . Food insecurity    Worry: Not on file    Inability: Not on file  . Transportation needs    Medical: Not on file    Non-medical: Not on file  Tobacco Use  . Smoking status: Former Smoker    Types: Cigarettes  Quit date: 06/16/2011    Years since quitting: 7.5  . Smokeless tobacco: Never Used  Substance and Sexual Activity  . Alcohol use: No  . Drug use: Not on file  . Sexual activity: Not on file  Lifestyle  . Physical activity    Days per week: Not on file    Minutes per session: Not on file  . Stress: Not on file  Relationships  . Social Herbalist on phone: Not on file    Gets together: Not on file    Attends religious service: Not on file    Active member of club or organization: Not on file    Attends meetings of clubs or organizations: Not on file    Relationship status: Not on file  . Intimate partner violence    Fear of current or ex partner: Not on file    Emotionally abused: Not on file    Physically abused: Not on file    Forced sexual activity: Not on file  Other Topics  Concern  . Not on file  Social History Narrative  . Not on file     BP (!) 152/90   Pulse 63   Ht 5\' 7"  (1.702 m)   Wt 168 lb (76.2 kg)   SpO2 99%   BMI 26.31 kg/m   Physical Exam:  Well appearing NAD HEENT: Unremarkable Neck:  No JVD, no thyromegally Lymphatics:  No adenopathy Back:  No CVA tenderness Lungs:  Clear HEART:  Regular rate rhythm, no murmurs, no rubs, no clicks Abd:  soft, positive bowel sounds, no organomegally, no rebound, no guarding Ext:  2 plus pulses, no edema, no cyanosis, no clubbing Skin:  No rashes no nodules Neuro:  CN II through XII intact, motor grossly intact  EKG - NSR with atrial pacing  DEVICE  Normal device function.  See PaceArt for details.   Assess/Plan: 1. Sinus node dysfunction - she is asymptomatic, s/p PPM insertion. 2. PPM - her St. Jude DDD PM is working normally.  3. Obesity - she is encourage to lose weight.  Mikle Bosworth.D.

## 2019-01-11 ENCOUNTER — Telehealth: Payer: Self-pay

## 2019-01-11 NOTE — Telephone Encounter (Signed)
Left message for patient to remind of missed remote transmission.  

## 2019-01-16 DIAGNOSIS — H353221 Exudative age-related macular degeneration, left eye, with active choroidal neovascularization: Secondary | ICD-10-CM | POA: Diagnosis not present

## 2019-01-16 DIAGNOSIS — H35372 Puckering of macula, left eye: Secondary | ICD-10-CM | POA: Diagnosis not present

## 2019-01-16 DIAGNOSIS — H353112 Nonexudative age-related macular degeneration, right eye, intermediate dry stage: Secondary | ICD-10-CM | POA: Diagnosis not present

## 2019-01-23 ENCOUNTER — Encounter: Payer: Self-pay | Admitting: Cardiology

## 2019-01-26 DIAGNOSIS — M545 Low back pain: Secondary | ICD-10-CM | POA: Diagnosis not present

## 2019-01-27 LAB — CUP PACEART INCLINIC DEVICE CHECK
Date Time Interrogation Session: 20200814155012
Implantable Lead Implant Date: 20001030
Implantable Lead Implant Date: 20110216
Implantable Lead Location: 753859
Implantable Lead Location: 753860
Implantable Pulse Generator Implant Date: 20200427
Pulse Gen Model: 2272
Pulse Gen Serial Number: 9123200

## 2019-02-03 ENCOUNTER — Ambulatory Visit (INDEPENDENT_AMBULATORY_CARE_PROVIDER_SITE_OTHER): Payer: Medicare HMO | Admitting: *Deleted

## 2019-02-03 DIAGNOSIS — I495 Sick sinus syndrome: Secondary | ICD-10-CM | POA: Diagnosis not present

## 2019-02-07 LAB — CUP PACEART REMOTE DEVICE CHECK
Battery Remaining Longevity: 118 mo
Battery Remaining Percentage: 95.5 %
Battery Voltage: 3.04 V
Brady Statistic AP VP Percent: 1 %
Brady Statistic AP VS Percent: 94 %
Brady Statistic AS VP Percent: 1 %
Brady Statistic AS VS Percent: 5.7 %
Brady Statistic RA Percent Paced: 93 %
Brady Statistic RV Percent Paced: 1 %
Date Time Interrogation Session: 20200823173425
Implantable Lead Implant Date: 20001030
Implantable Lead Implant Date: 20110216
Implantable Lead Location: 753859
Implantable Lead Location: 753860
Implantable Pulse Generator Implant Date: 20200427
Lead Channel Impedance Value: 510 Ohm
Lead Channel Impedance Value: 550 Ohm
Lead Channel Pacing Threshold Amplitude: 0.75 V
Lead Channel Pacing Threshold Amplitude: 1.25 V
Lead Channel Pacing Threshold Pulse Width: 0.5 ms
Lead Channel Pacing Threshold Pulse Width: 0.5 ms
Lead Channel Sensing Intrinsic Amplitude: 2.5 mV
Lead Channel Sensing Intrinsic Amplitude: 7.3 mV
Lead Channel Setting Pacing Amplitude: 1.75 V
Lead Channel Setting Pacing Amplitude: 2.5 V
Lead Channel Setting Pacing Pulse Width: 0.5 ms
Lead Channel Setting Sensing Sensitivity: 2 mV
Pulse Gen Model: 2272
Pulse Gen Serial Number: 9123200

## 2019-02-09 DIAGNOSIS — R69 Illness, unspecified: Secondary | ICD-10-CM | POA: Diagnosis not present

## 2019-02-10 ENCOUNTER — Encounter: Payer: Self-pay | Admitting: Cardiology

## 2019-02-10 NOTE — Progress Notes (Signed)
Remote pacemaker transmission.   

## 2019-02-16 DIAGNOSIS — H353221 Exudative age-related macular degeneration, left eye, with active choroidal neovascularization: Secondary | ICD-10-CM | POA: Diagnosis not present

## 2019-03-16 DIAGNOSIS — H353221 Exudative age-related macular degeneration, left eye, with active choroidal neovascularization: Secondary | ICD-10-CM | POA: Diagnosis not present

## 2019-05-01 DIAGNOSIS — E789 Disorder of lipoprotein metabolism, unspecified: Secondary | ICD-10-CM | POA: Diagnosis not present

## 2019-05-01 DIAGNOSIS — D649 Anemia, unspecified: Secondary | ICD-10-CM | POA: Diagnosis not present

## 2019-05-01 DIAGNOSIS — I10 Essential (primary) hypertension: Secondary | ICD-10-CM | POA: Diagnosis not present

## 2019-05-01 DIAGNOSIS — E032 Hypothyroidism due to medicaments and other exogenous substances: Secondary | ICD-10-CM | POA: Diagnosis not present

## 2019-05-01 DIAGNOSIS — E039 Hypothyroidism, unspecified: Secondary | ICD-10-CM | POA: Diagnosis not present

## 2019-05-05 ENCOUNTER — Ambulatory Visit (INDEPENDENT_AMBULATORY_CARE_PROVIDER_SITE_OTHER): Payer: Medicare HMO | Admitting: *Deleted

## 2019-05-05 DIAGNOSIS — I495 Sick sinus syndrome: Secondary | ICD-10-CM | POA: Diagnosis not present

## 2019-05-08 LAB — CUP PACEART REMOTE DEVICE CHECK
Battery Remaining Longevity: 116 mo
Battery Remaining Percentage: 95.5 %
Battery Voltage: 3.04 V
Brady Statistic AP VP Percent: 1 %
Brady Statistic AP VS Percent: 94 %
Brady Statistic AS VP Percent: 1 %
Brady Statistic AS VS Percent: 5.2 %
Brady Statistic RA Percent Paced: 94 %
Brady Statistic RV Percent Paced: 1 %
Date Time Interrogation Session: 20201123072554
Implantable Lead Implant Date: 20001030
Implantable Lead Implant Date: 20110216
Implantable Lead Location: 753859
Implantable Lead Location: 753860
Implantable Pulse Generator Implant Date: 20200427
Lead Channel Impedance Value: 530 Ohm
Lead Channel Impedance Value: 550 Ohm
Lead Channel Pacing Threshold Amplitude: 0.625 V
Lead Channel Pacing Threshold Amplitude: 1.25 V
Lead Channel Pacing Threshold Pulse Width: 0.5 ms
Lead Channel Pacing Threshold Pulse Width: 0.5 ms
Lead Channel Sensing Intrinsic Amplitude: 1.7 mV
Lead Channel Sensing Intrinsic Amplitude: 8.4 mV
Lead Channel Setting Pacing Amplitude: 1.625
Lead Channel Setting Pacing Amplitude: 2.5 V
Lead Channel Setting Pacing Pulse Width: 0.5 ms
Lead Channel Setting Sensing Sensitivity: 2 mV
Pulse Gen Model: 2272
Pulse Gen Serial Number: 9123200

## 2019-05-10 DIAGNOSIS — E039 Hypothyroidism, unspecified: Secondary | ICD-10-CM | POA: Diagnosis not present

## 2019-05-10 DIAGNOSIS — R69 Illness, unspecified: Secondary | ICD-10-CM | POA: Diagnosis not present

## 2019-05-10 DIAGNOSIS — E559 Vitamin D deficiency, unspecified: Secondary | ICD-10-CM | POA: Diagnosis not present

## 2019-05-10 DIAGNOSIS — H353221 Exudative age-related macular degeneration, left eye, with active choroidal neovascularization: Secondary | ICD-10-CM | POA: Diagnosis not present

## 2019-05-10 DIAGNOSIS — Z23 Encounter for immunization: Secondary | ICD-10-CM | POA: Diagnosis not present

## 2019-05-10 DIAGNOSIS — I10 Essential (primary) hypertension: Secondary | ICD-10-CM | POA: Diagnosis not present

## 2019-05-10 DIAGNOSIS — Z0000 Encounter for general adult medical examination without abnormal findings: Secondary | ICD-10-CM | POA: Diagnosis not present

## 2019-05-14 IMAGING — US US PELVIS LIMITED
1 series · 12 of 12 positions shown · non-contrast
Comparison: Outside films from 02/03/2018

CLINICAL DATA: Palpable abnormality over the sacrum

EXAM:
LIMITED ULTRASOUND OF PELVIS
TECHNIQUE: Limited transabdominal ultrasound examination of the pelvis was
performed.

[Series 1: us pelvis limited · 0.07mm/px · 12 acquisitions, 12 frames shown]
[im 1/12]
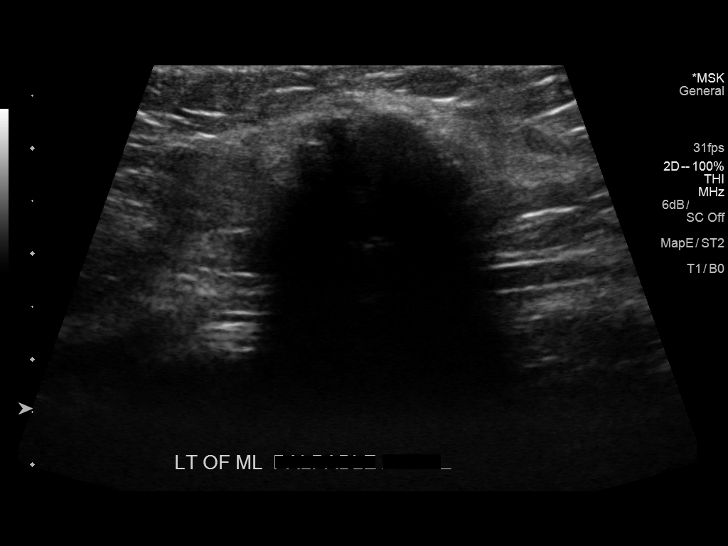
[im 2/12]
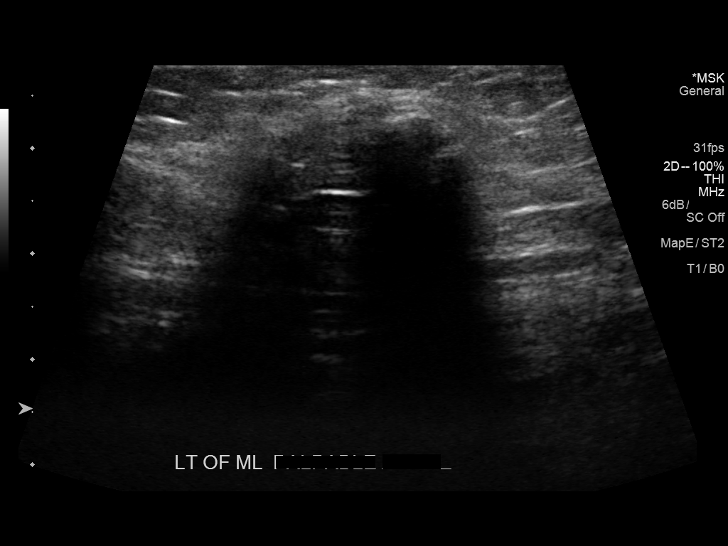
[im 3/12]
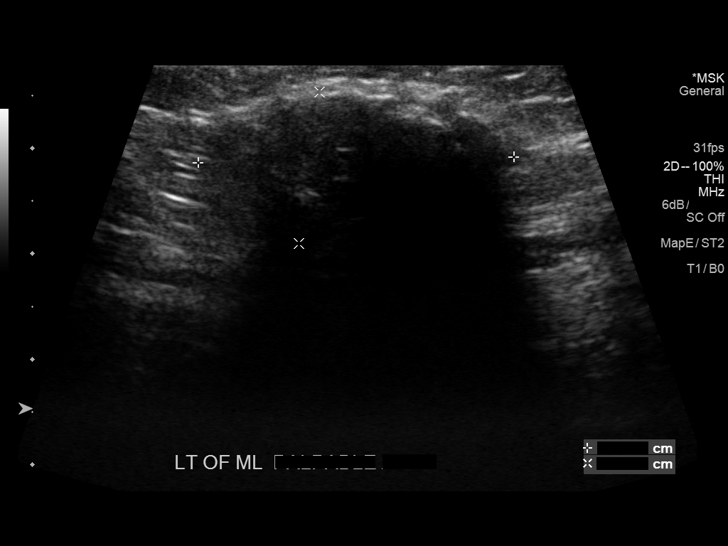
[im 4/12]
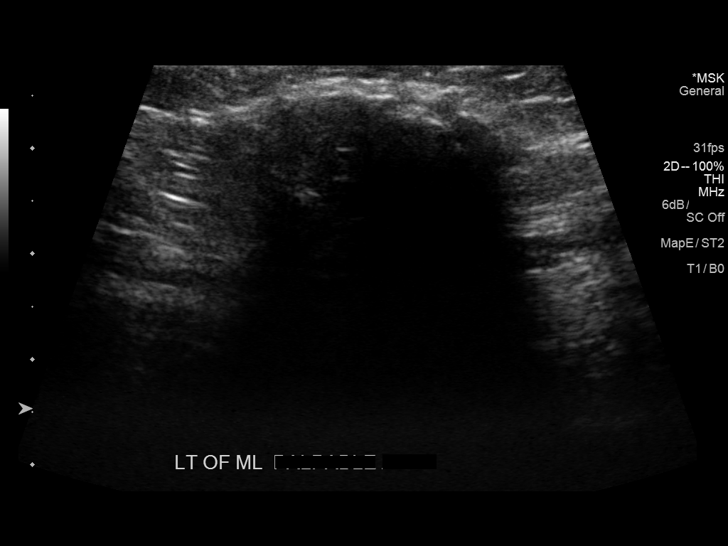
[im 5/12]
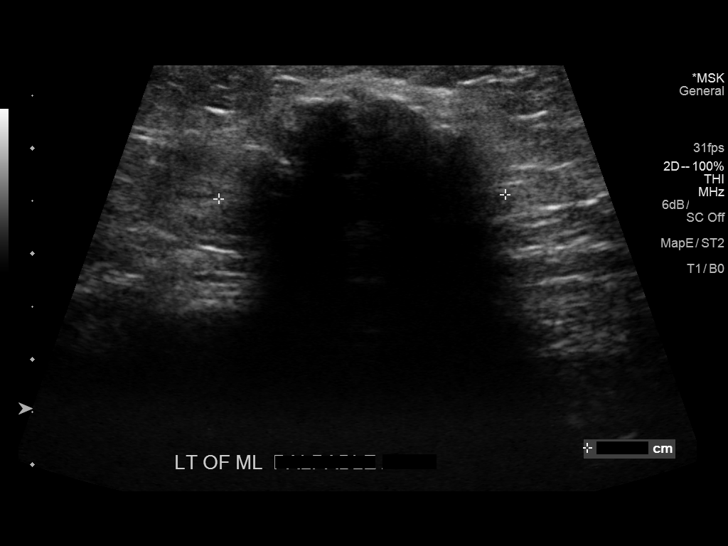
[im 6/12]
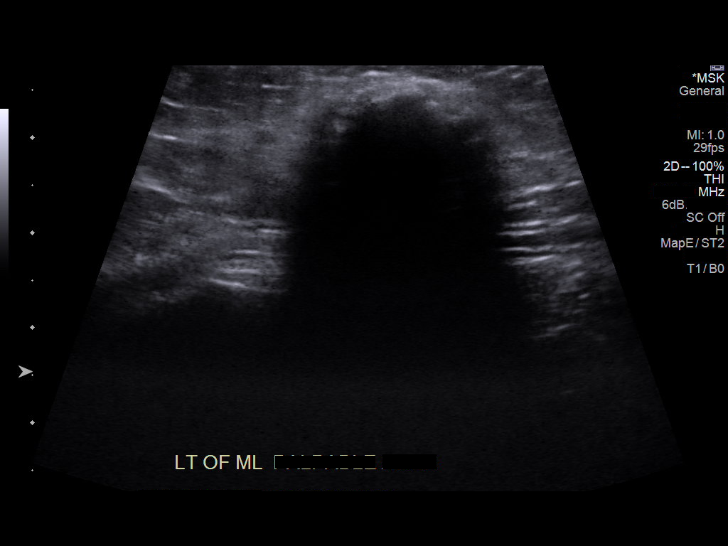
[im 7/12]
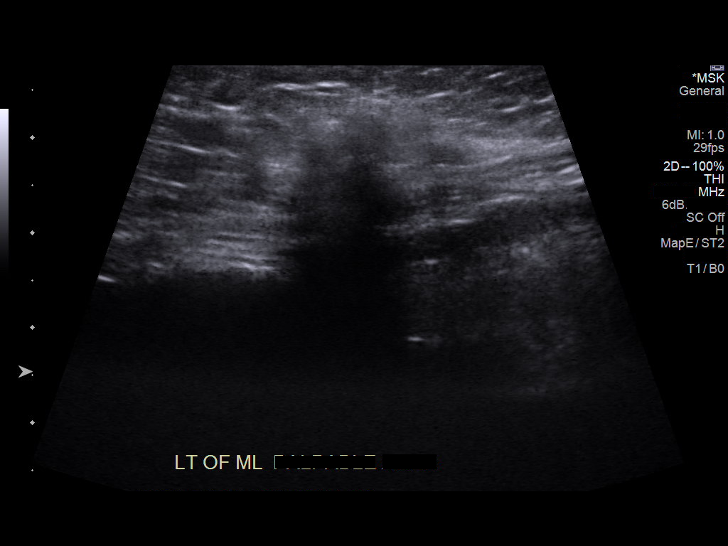
[im 8/12]
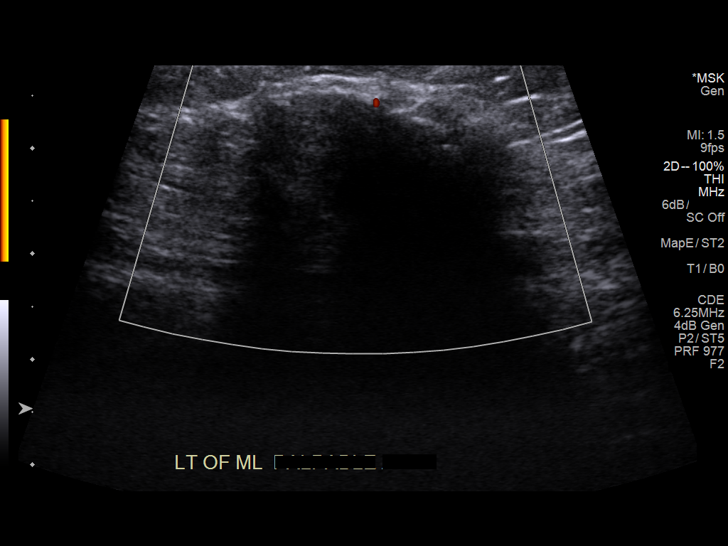
[im 9/12]
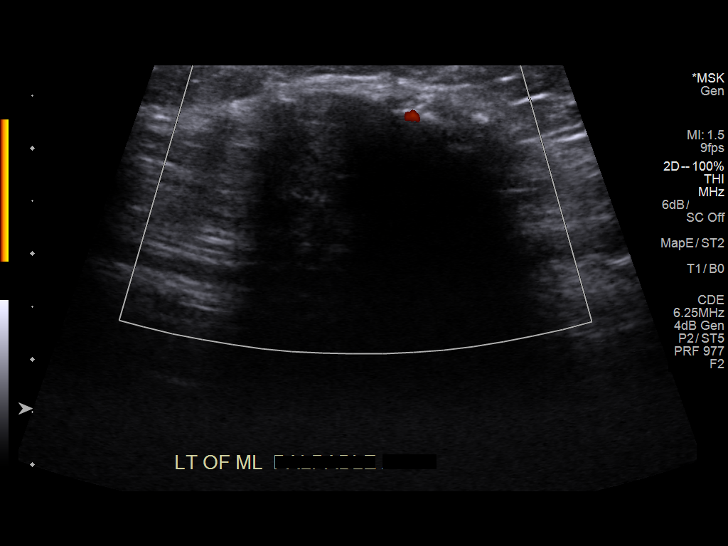
[im 10/12]
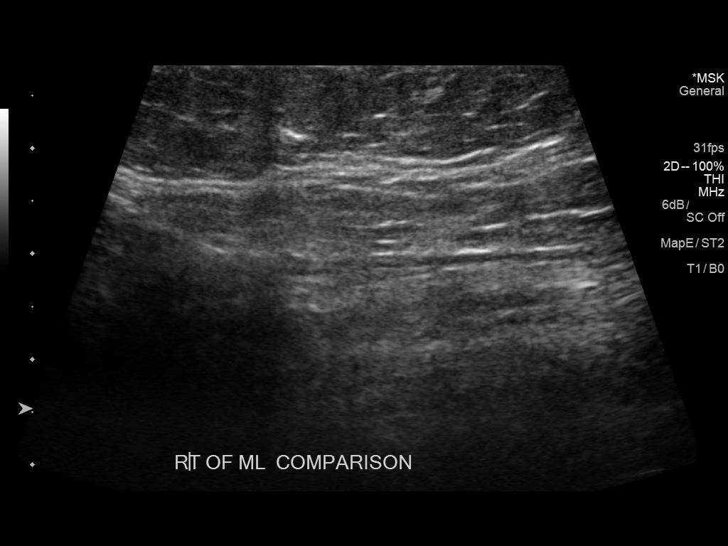
[im 11/12]
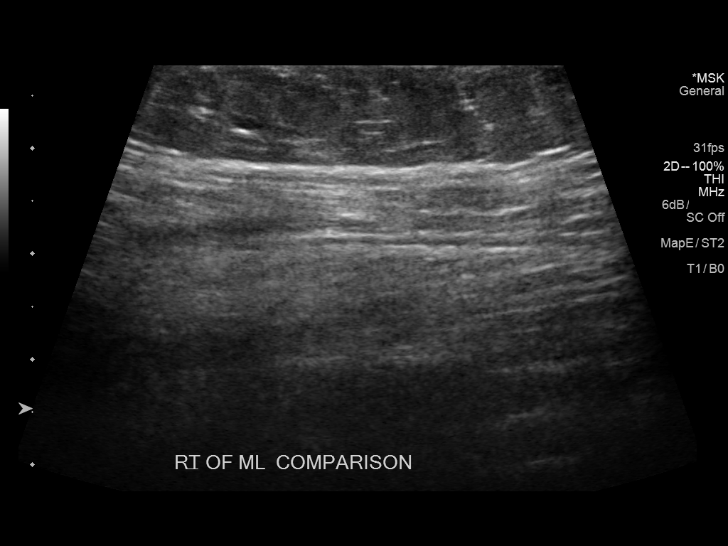
[im 12/12]
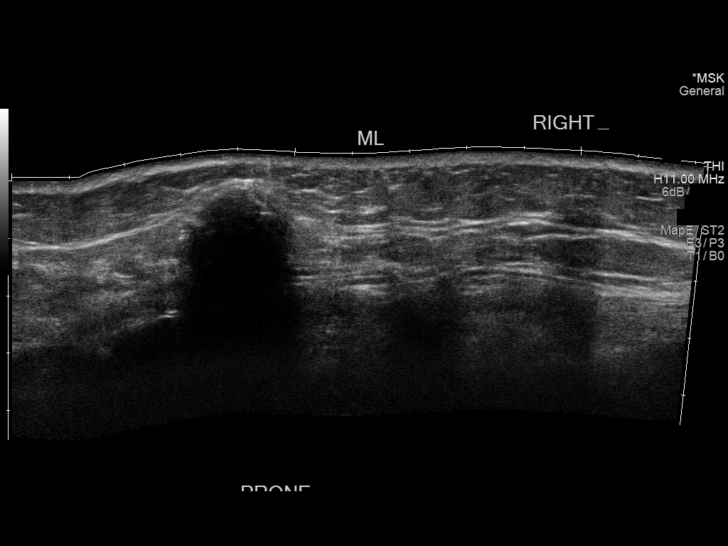

[12 of 12 positions shown; findings below may reference images not displayed]

FINDINGS: Sonographic evaluation in the area of clinical concern shows a
calcified soft tissue lesion which measures approximately 3.0 x
x 2.7 cm. This corresponds to a soft tissue lesion identified just
to the left of the midline overlying the sacrum on the prior exam.
This likely represents a calcified hematoma or partially calcified
lipoma. No other focal abnormality is noted.
IMPRESSION: Partially calcified lesion in the subcutaneous tissues over the left
sacrum. This corresponds to an abnormality seen on prior CT
examination and likely represents a partially calcified lipoma or
resolving hematoma. Clinical follow-up is recommended.

## 2019-05-23 DIAGNOSIS — M47813 Spondylosis without myelopathy or radiculopathy, cervicothoracic region: Secondary | ICD-10-CM | POA: Diagnosis not present

## 2019-05-30 NOTE — Progress Notes (Signed)
Remote pacemaker transmission.   

## 2019-06-13 DIAGNOSIS — M47812 Spondylosis without myelopathy or radiculopathy, cervical region: Secondary | ICD-10-CM | POA: Diagnosis not present

## 2019-06-13 DIAGNOSIS — M479 Spondylosis, unspecified: Secondary | ICD-10-CM | POA: Diagnosis not present

## 2019-07-03 DIAGNOSIS — L299 Pruritus, unspecified: Secondary | ICD-10-CM | POA: Diagnosis not present

## 2019-07-11 DIAGNOSIS — Z6828 Body mass index (BMI) 28.0-28.9, adult: Secondary | ICD-10-CM | POA: Diagnosis not present

## 2019-07-11 DIAGNOSIS — M545 Low back pain: Secondary | ICD-10-CM | POA: Diagnosis not present

## 2019-07-11 DIAGNOSIS — I10 Essential (primary) hypertension: Secondary | ICD-10-CM | POA: Diagnosis not present

## 2019-07-11 DIAGNOSIS — M479 Spondylosis, unspecified: Secondary | ICD-10-CM | POA: Diagnosis not present

## 2019-08-08 ENCOUNTER — Ambulatory Visit (INDEPENDENT_AMBULATORY_CARE_PROVIDER_SITE_OTHER): Payer: Medicare HMO | Admitting: *Deleted

## 2019-08-08 DIAGNOSIS — I495 Sick sinus syndrome: Secondary | ICD-10-CM

## 2019-08-08 LAB — CUP PACEART REMOTE DEVICE CHECK
Battery Remaining Longevity: 115 mo
Battery Remaining Percentage: 95.5 %
Battery Voltage: 3.04 V
Brady Statistic AP VP Percent: 1 %
Brady Statistic AP VS Percent: 93 %
Brady Statistic AS VP Percent: 1 %
Brady Statistic AS VS Percent: 5.7 %
Brady Statistic RA Percent Paced: 93 %
Brady Statistic RV Percent Paced: 1 %
Date Time Interrogation Session: 20210222215954
Implantable Lead Implant Date: 20001030
Implantable Lead Implant Date: 20110216
Implantable Lead Location: 753859
Implantable Lead Location: 753860
Implantable Pulse Generator Implant Date: 20200427
Lead Channel Impedance Value: 510 Ohm
Lead Channel Impedance Value: 590 Ohm
Lead Channel Pacing Threshold Amplitude: 0.625 V
Lead Channel Pacing Threshold Amplitude: 1.25 V
Lead Channel Pacing Threshold Pulse Width: 0.5 ms
Lead Channel Pacing Threshold Pulse Width: 0.5 ms
Lead Channel Sensing Intrinsic Amplitude: 10.3 mV
Lead Channel Sensing Intrinsic Amplitude: 3.1 mV
Lead Channel Setting Pacing Amplitude: 1.625
Lead Channel Setting Pacing Amplitude: 2.5 V
Lead Channel Setting Pacing Pulse Width: 0.5 ms
Lead Channel Setting Sensing Sensitivity: 2 mV
Pulse Gen Model: 2272
Pulse Gen Serial Number: 9123200

## 2019-08-09 NOTE — Progress Notes (Signed)
PPM Remote  

## 2019-08-10 DIAGNOSIS — E032 Hypothyroidism due to medicaments and other exogenous substances: Secondary | ICD-10-CM | POA: Diagnosis not present

## 2019-08-10 DIAGNOSIS — R69 Illness, unspecified: Secondary | ICD-10-CM | POA: Diagnosis not present

## 2019-08-10 DIAGNOSIS — E559 Vitamin D deficiency, unspecified: Secondary | ICD-10-CM | POA: Diagnosis not present

## 2019-08-10 DIAGNOSIS — E039 Hypothyroidism, unspecified: Secondary | ICD-10-CM | POA: Diagnosis not present

## 2019-08-10 DIAGNOSIS — E7800 Pure hypercholesterolemia, unspecified: Secondary | ICD-10-CM | POA: Diagnosis not present

## 2019-08-10 DIAGNOSIS — I10 Essential (primary) hypertension: Secondary | ICD-10-CM | POA: Diagnosis not present

## 2019-08-23 DIAGNOSIS — H35372 Puckering of macula, left eye: Secondary | ICD-10-CM | POA: Diagnosis not present

## 2019-08-23 DIAGNOSIS — H353112 Nonexudative age-related macular degeneration, right eye, intermediate dry stage: Secondary | ICD-10-CM | POA: Diagnosis not present

## 2019-08-23 DIAGNOSIS — H353221 Exudative age-related macular degeneration, left eye, with active choroidal neovascularization: Secondary | ICD-10-CM | POA: Diagnosis not present

## 2019-09-08 DIAGNOSIS — R69 Illness, unspecified: Secondary | ICD-10-CM | POA: Diagnosis not present

## 2019-09-15 DIAGNOSIS — R519 Headache, unspecified: Secondary | ICD-10-CM | POA: Diagnosis not present

## 2019-09-15 DIAGNOSIS — R69 Illness, unspecified: Secondary | ICD-10-CM | POA: Diagnosis not present

## 2019-09-15 DIAGNOSIS — B349 Viral infection, unspecified: Secondary | ICD-10-CM | POA: Diagnosis not present

## 2019-09-15 DIAGNOSIS — R05 Cough: Secondary | ICD-10-CM | POA: Diagnosis not present

## 2019-09-15 DIAGNOSIS — R0981 Nasal congestion: Secondary | ICD-10-CM | POA: Diagnosis not present

## 2019-09-15 DIAGNOSIS — R918 Other nonspecific abnormal finding of lung field: Secondary | ICD-10-CM | POA: Diagnosis not present

## 2019-09-15 DIAGNOSIS — R0602 Shortness of breath: Secondary | ICD-10-CM | POA: Diagnosis not present

## 2019-09-15 DIAGNOSIS — Z20822 Contact with and (suspected) exposure to covid-19: Secondary | ICD-10-CM | POA: Diagnosis not present

## 2019-09-15 DIAGNOSIS — J984 Other disorders of lung: Secondary | ICD-10-CM | POA: Diagnosis not present

## 2019-09-27 DIAGNOSIS — E559 Vitamin D deficiency, unspecified: Secondary | ICD-10-CM | POA: Diagnosis not present

## 2019-09-27 DIAGNOSIS — E039 Hypothyroidism, unspecified: Secondary | ICD-10-CM | POA: Diagnosis not present

## 2019-10-01 DIAGNOSIS — R69 Illness, unspecified: Secondary | ICD-10-CM | POA: Diagnosis not present

## 2019-11-09 ENCOUNTER — Telehealth: Payer: Self-pay

## 2019-11-09 NOTE — Telephone Encounter (Signed)
Spoke with patient to remind of missed remote transmission 

## 2019-11-23 DIAGNOSIS — H353221 Exudative age-related macular degeneration, left eye, with active choroidal neovascularization: Secondary | ICD-10-CM | POA: Diagnosis not present

## 2019-11-29 DIAGNOSIS — S0502XA Injury of conjunctiva and corneal abrasion without foreign body, left eye, initial encounter: Secondary | ICD-10-CM | POA: Diagnosis not present

## 2019-12-29 DIAGNOSIS — M25552 Pain in left hip: Secondary | ICD-10-CM | POA: Diagnosis not present

## 2019-12-29 DIAGNOSIS — M533 Sacrococcygeal disorders, not elsewhere classified: Secondary | ICD-10-CM | POA: Diagnosis not present

## 2019-12-29 DIAGNOSIS — M5136 Other intervertebral disc degeneration, lumbar region: Secondary | ICD-10-CM | POA: Diagnosis not present

## 2019-12-29 DIAGNOSIS — M5416 Radiculopathy, lumbar region: Secondary | ICD-10-CM | POA: Diagnosis not present

## 2019-12-29 DIAGNOSIS — G8929 Other chronic pain: Secondary | ICD-10-CM | POA: Diagnosis not present

## 2019-12-29 DIAGNOSIS — M12811 Other specific arthropathies, not elsewhere classified, right shoulder: Secondary | ICD-10-CM | POA: Diagnosis not present

## 2020-02-06 ENCOUNTER — Ambulatory Visit (INDEPENDENT_AMBULATORY_CARE_PROVIDER_SITE_OTHER): Payer: Medicare HMO | Admitting: *Deleted

## 2020-02-06 DIAGNOSIS — I495 Sick sinus syndrome: Secondary | ICD-10-CM | POA: Diagnosis not present

## 2020-02-08 LAB — CUP PACEART REMOTE DEVICE CHECK
Battery Remaining Longevity: 115 mo
Battery Remaining Percentage: 95.5 %
Battery Voltage: 3.02 V
Brady Statistic AP VP Percent: 1 %
Brady Statistic AP VS Percent: 93 %
Brady Statistic AS VP Percent: 1 %
Brady Statistic AS VS Percent: 6.1 %
Brady Statistic RA Percent Paced: 93 %
Brady Statistic RV Percent Paced: 1 %
Date Time Interrogation Session: 20210826150401
Implantable Lead Implant Date: 20001030
Implantable Lead Implant Date: 20110216
Implantable Lead Location: 753859
Implantable Lead Location: 753860
Implantable Pulse Generator Implant Date: 20200427
Lead Channel Impedance Value: 530 Ohm
Lead Channel Impedance Value: 530 Ohm
Lead Channel Pacing Threshold Amplitude: 1.125 V
Lead Channel Pacing Threshold Amplitude: 1.25 V
Lead Channel Pacing Threshold Pulse Width: 0.5 ms
Lead Channel Pacing Threshold Pulse Width: 0.5 ms
Lead Channel Sensing Intrinsic Amplitude: 2.7 mV
Lead Channel Sensing Intrinsic Amplitude: 6.9 mV
Lead Channel Setting Pacing Amplitude: 2.125
Lead Channel Setting Pacing Amplitude: 2.5 V
Lead Channel Setting Pacing Pulse Width: 0.5 ms
Lead Channel Setting Sensing Sensitivity: 2 mV
Pulse Gen Model: 2272
Pulse Gen Serial Number: 9123200

## 2020-02-12 NOTE — Progress Notes (Signed)
Remote pacemaker transmission.   

## 2020-03-12 DIAGNOSIS — G8929 Other chronic pain: Secondary | ICD-10-CM | POA: Diagnosis not present

## 2020-03-12 DIAGNOSIS — J449 Chronic obstructive pulmonary disease, unspecified: Secondary | ICD-10-CM | POA: Diagnosis not present

## 2020-03-12 DIAGNOSIS — I495 Sick sinus syndrome: Secondary | ICD-10-CM | POA: Diagnosis not present

## 2020-03-12 DIAGNOSIS — I499 Cardiac arrhythmia, unspecified: Secondary | ICD-10-CM | POA: Diagnosis not present

## 2020-03-12 DIAGNOSIS — E039 Hypothyroidism, unspecified: Secondary | ICD-10-CM | POA: Diagnosis not present

## 2020-03-12 DIAGNOSIS — E785 Hyperlipidemia, unspecified: Secondary | ICD-10-CM | POA: Diagnosis not present

## 2020-03-12 DIAGNOSIS — R69 Illness, unspecified: Secondary | ICD-10-CM | POA: Diagnosis not present

## 2020-03-12 DIAGNOSIS — I10 Essential (primary) hypertension: Secondary | ICD-10-CM | POA: Diagnosis not present

## 2020-03-12 DIAGNOSIS — Z008 Encounter for other general examination: Secondary | ICD-10-CM | POA: Diagnosis not present

## 2020-05-07 ENCOUNTER — Ambulatory Visit (INDEPENDENT_AMBULATORY_CARE_PROVIDER_SITE_OTHER): Payer: Medicare HMO

## 2020-05-07 DIAGNOSIS — R001 Bradycardia, unspecified: Secondary | ICD-10-CM | POA: Diagnosis not present

## 2020-05-07 LAB — CUP PACEART REMOTE DEVICE CHECK
Battery Remaining Longevity: 122 mo
Battery Remaining Percentage: 95.5 %
Battery Voltage: 3.02 V
Brady Statistic AP VP Percent: 1.1 %
Brady Statistic AP VS Percent: 93 %
Brady Statistic AS VP Percent: 1 %
Brady Statistic AS VS Percent: 6.2 %
Brady Statistic RA Percent Paced: 93 %
Brady Statistic RV Percent Paced: 1.1 %
Date Time Interrogation Session: 20211122125408
Implantable Lead Implant Date: 20001030
Implantable Lead Implant Date: 20110216
Implantable Lead Location: 753859
Implantable Lead Location: 753860
Implantable Pulse Generator Implant Date: 20200427
Lead Channel Impedance Value: 560 Ohm
Lead Channel Impedance Value: 580 Ohm
Lead Channel Pacing Threshold Amplitude: 0.625 V
Lead Channel Pacing Threshold Amplitude: 1.25 V
Lead Channel Pacing Threshold Pulse Width: 0.5 ms
Lead Channel Pacing Threshold Pulse Width: 0.5 ms
Lead Channel Sensing Intrinsic Amplitude: 2.6 mV
Lead Channel Sensing Intrinsic Amplitude: 7.9 mV
Lead Channel Setting Pacing Amplitude: 1.625
Lead Channel Setting Pacing Amplitude: 2.5 V
Lead Channel Setting Pacing Pulse Width: 0.5 ms
Lead Channel Setting Sensing Sensitivity: 2 mV
Pulse Gen Model: 2272
Pulse Gen Serial Number: 9123200

## 2020-05-14 NOTE — Progress Notes (Signed)
Remote pacemaker transmission.   

## 2020-06-20 IMAGING — MG DIGITAL SCREENING BILATERAL MAMMOGRAM WITH CAD
4 series · 4 of 4 positions shown · non-contrast
Comparison: None.

CLINICAL DATA: Screening.

EXAM:
DIGITAL SCREENING BILATERAL MAMMOGRAM WITH CAD

[L MLO]
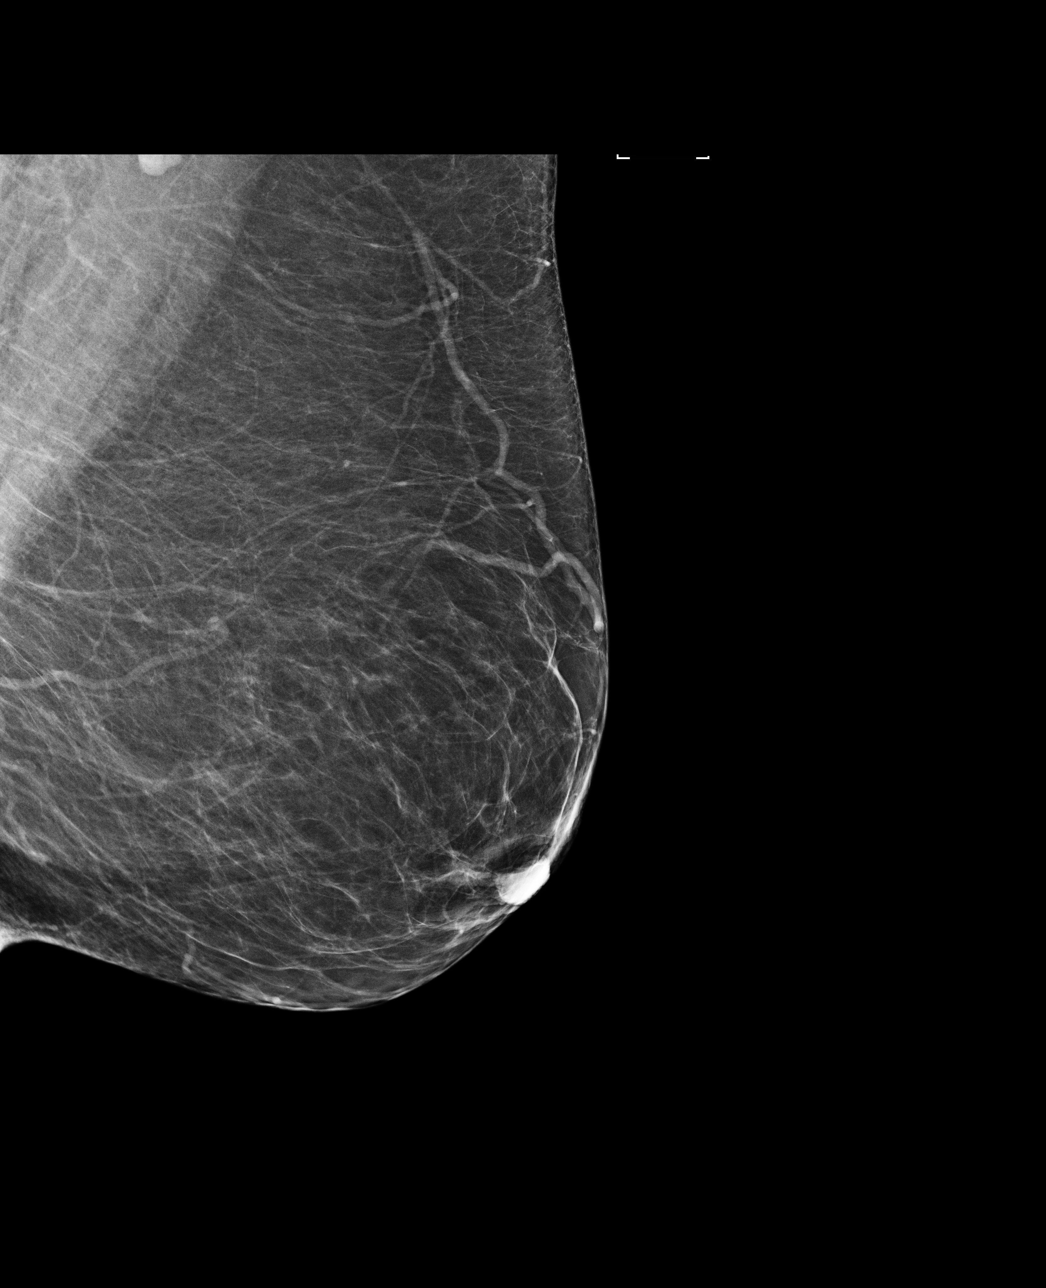

[R MLO]
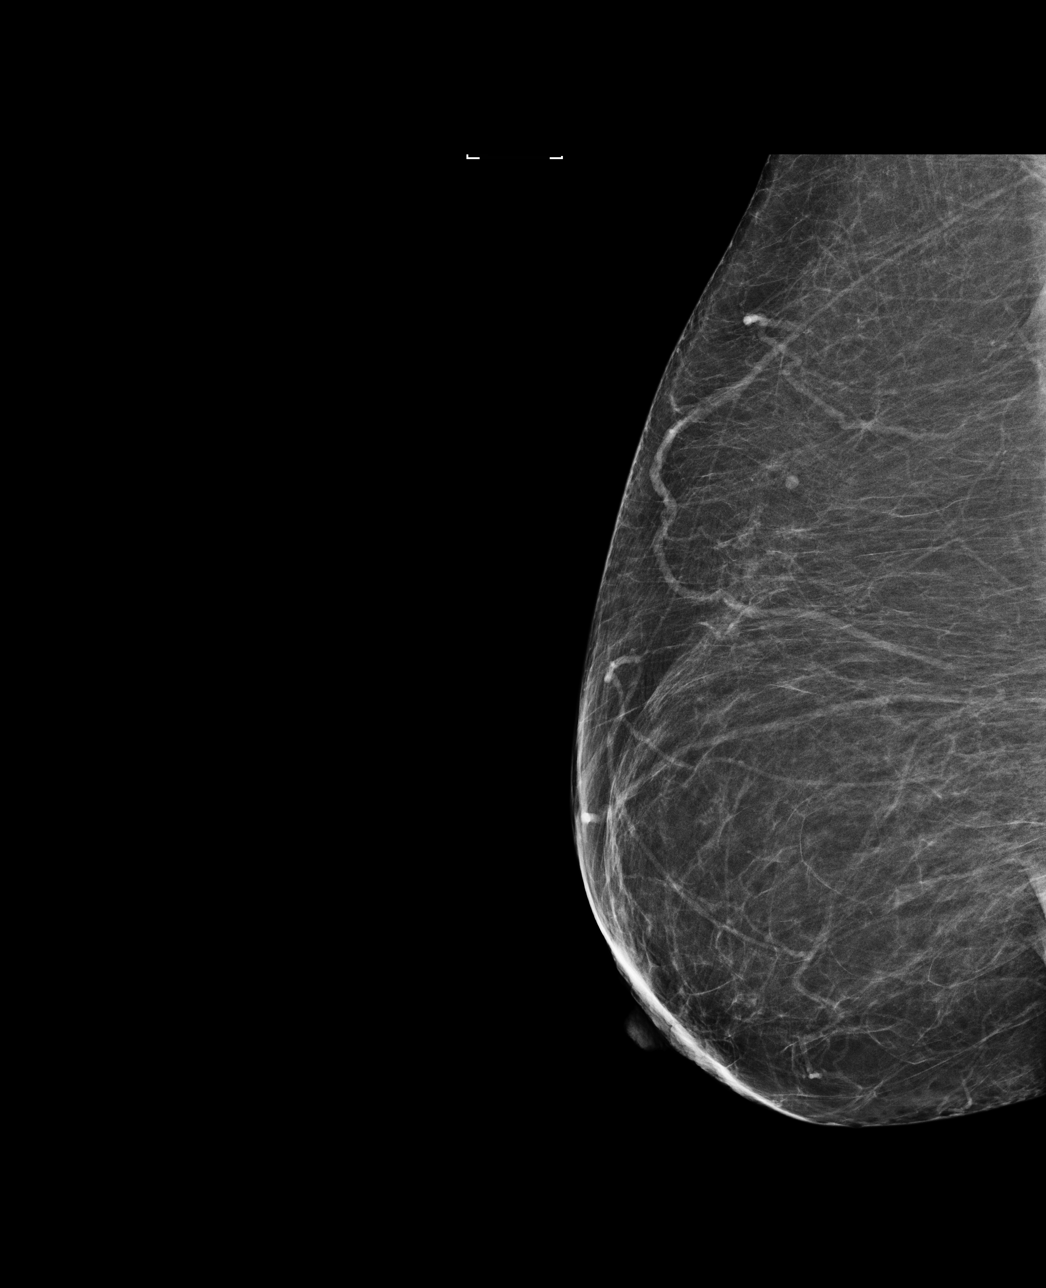

[R CC]
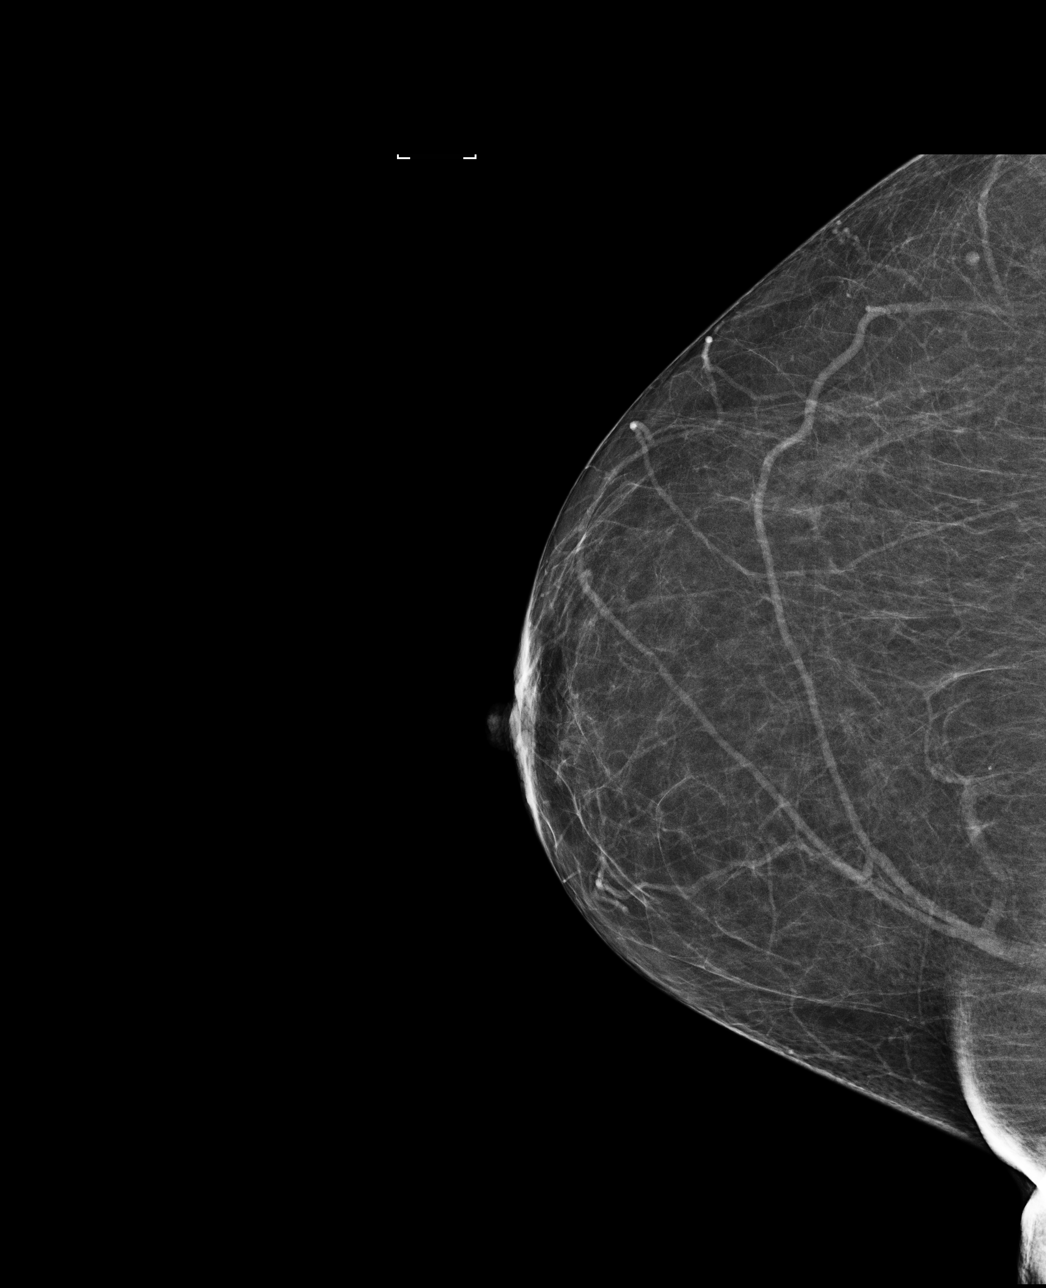

[L CC]
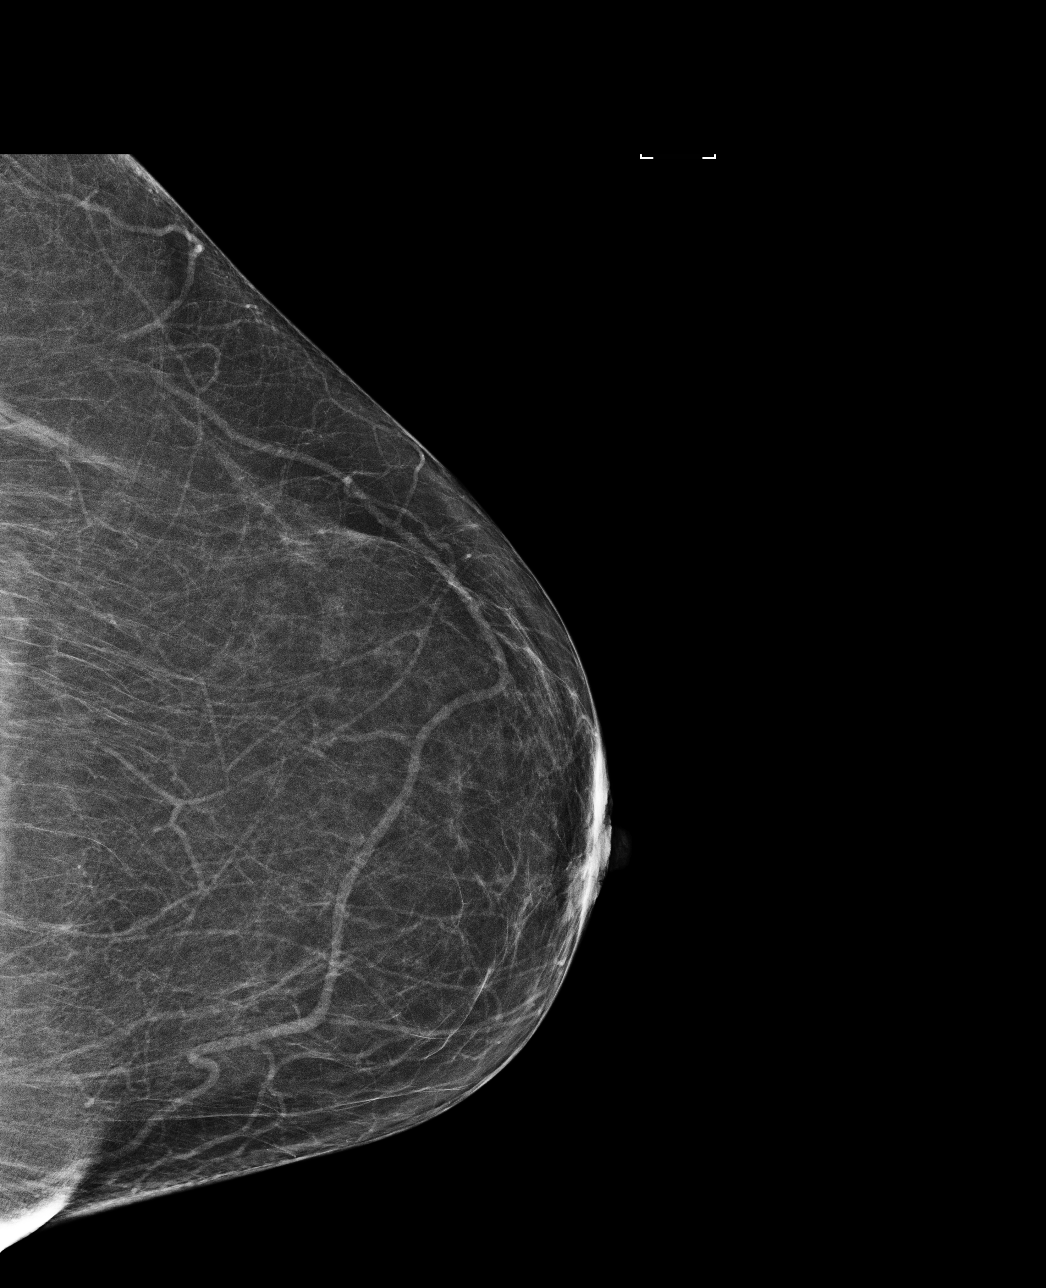

[4 of 4 positions shown; findings below may reference images not displayed]

ACR Breast Density Category b: There are scattered areas of
fibroglandular density.
FINDINGS: There are no findings suspicious for malignancy. Images were
processed with CAD.
IMPRESSION: No mammographic evidence of malignancy. A result letter of this
screening mammogram will be mailed directly to the patient.

RECOMMENDATION:
Screening mammogram in one year. (Code:[id number])

BI-RADS CATEGORY  1: Negative.

## 2020-08-27 ENCOUNTER — Ambulatory Visit (INDEPENDENT_AMBULATORY_CARE_PROVIDER_SITE_OTHER): Payer: Medicare HMO

## 2020-08-27 DIAGNOSIS — R001 Bradycardia, unspecified: Secondary | ICD-10-CM

## 2020-08-27 LAB — CUP PACEART REMOTE DEVICE CHECK
Battery Remaining Longevity: 122 mo
Battery Remaining Percentage: 95.5 %
Battery Voltage: 3.02 V
Brady Statistic AP VP Percent: 1.1 %
Brady Statistic AP VS Percent: 93 %
Brady Statistic AS VP Percent: 1 %
Brady Statistic AS VS Percent: 5.9 %
Brady Statistic RA Percent Paced: 93 %
Brady Statistic RV Percent Paced: 1.1 %
Date Time Interrogation Session: 20220315095739
Implantable Lead Implant Date: 20001030
Implantable Lead Implant Date: 20110216
Implantable Lead Location: 753859
Implantable Lead Location: 753860
Implantable Pulse Generator Implant Date: 20200427
Lead Channel Impedance Value: 550 Ohm
Lead Channel Impedance Value: 600 Ohm
Lead Channel Pacing Threshold Amplitude: 0.625 V
Lead Channel Pacing Threshold Amplitude: 1.25 V
Lead Channel Pacing Threshold Pulse Width: 0.5 ms
Lead Channel Pacing Threshold Pulse Width: 0.5 ms
Lead Channel Sensing Intrinsic Amplitude: 1.7 mV
Lead Channel Sensing Intrinsic Amplitude: 9.9 mV
Lead Channel Setting Pacing Amplitude: 1.625
Lead Channel Setting Pacing Amplitude: 2.5 V
Lead Channel Setting Pacing Pulse Width: 0.5 ms
Lead Channel Setting Sensing Sensitivity: 2 mV
Pulse Gen Model: 2272
Pulse Gen Serial Number: 9123200

## 2020-09-04 NOTE — Progress Notes (Signed)
Remote pacemaker transmission.   

## 2020-12-19 ENCOUNTER — Ambulatory Visit (INDEPENDENT_AMBULATORY_CARE_PROVIDER_SITE_OTHER): Payer: Self-pay

## 2020-12-19 DIAGNOSIS — I495 Sick sinus syndrome: Secondary | ICD-10-CM

## 2020-12-19 LAB — CUP PACEART REMOTE DEVICE CHECK
Battery Remaining Longevity: 97 mo
Battery Remaining Percentage: 83 %
Battery Voltage: 3.02 V
Brady Statistic AP VP Percent: 1.1 %
Brady Statistic AP VS Percent: 93 %
Brady Statistic AS VP Percent: 1 %
Brady Statistic AS VS Percent: 6.2 %
Brady Statistic RA Percent Paced: 93 %
Brady Statistic RV Percent Paced: 1.2 %
Date Time Interrogation Session: 20220706200435
Implantable Lead Implant Date: 20001030
Implantable Lead Implant Date: 20110216
Implantable Lead Location: 753859
Implantable Lead Location: 753860
Implantable Pulse Generator Implant Date: 20200427
Lead Channel Impedance Value: 540 Ohm
Lead Channel Impedance Value: 650 Ohm
Lead Channel Pacing Threshold Amplitude: 1.25 V
Lead Channel Pacing Threshold Amplitude: 1.375 V
Lead Channel Pacing Threshold Pulse Width: 0.5 ms
Lead Channel Pacing Threshold Pulse Width: 0.5 ms
Lead Channel Sensing Intrinsic Amplitude: 12 mV
Lead Channel Sensing Intrinsic Amplitude: 2.1 mV
Lead Channel Setting Pacing Amplitude: 2.375
Lead Channel Setting Pacing Amplitude: 2.5 V
Lead Channel Setting Pacing Pulse Width: 0.5 ms
Lead Channel Setting Sensing Sensitivity: 2 mV
Pulse Gen Model: 2272
Pulse Gen Serial Number: 9123200

## 2021-01-10 NOTE — Progress Notes (Signed)
Remote pacemaker transmission.   

## 2021-03-20 ENCOUNTER — Ambulatory Visit (INDEPENDENT_AMBULATORY_CARE_PROVIDER_SITE_OTHER): Payer: Self-pay

## 2021-03-20 DIAGNOSIS — I495 Sick sinus syndrome: Secondary | ICD-10-CM

## 2021-03-26 LAB — CUP PACEART REMOTE DEVICE CHECK
Battery Remaining Longevity: 93 mo
Battery Remaining Percentage: 81 %
Battery Voltage: 3.02 V
Brady Statistic AP VP Percent: 1.2 %
Brady Statistic AP VS Percent: 93 %
Brady Statistic AS VP Percent: 1 %
Brady Statistic AS VS Percent: 6.2 %
Brady Statistic RA Percent Paced: 93 %
Brady Statistic RV Percent Paced: 1.2 %
Date Time Interrogation Session: 20221011204605
Implantable Lead Implant Date: 20001030
Implantable Lead Implant Date: 20110216
Implantable Lead Location: 753859
Implantable Lead Location: 753860
Implantable Pulse Generator Implant Date: 20200427
Lead Channel Impedance Value: 560 Ohm
Lead Channel Impedance Value: 560 Ohm
Lead Channel Pacing Threshold Amplitude: 1 V
Lead Channel Pacing Threshold Amplitude: 1.25 V
Lead Channel Pacing Threshold Pulse Width: 0.5 ms
Lead Channel Pacing Threshold Pulse Width: 0.5 ms
Lead Channel Sensing Intrinsic Amplitude: 2.4 mV
Lead Channel Sensing Intrinsic Amplitude: 9.2 mV
Lead Channel Setting Pacing Amplitude: 2 V
Lead Channel Setting Pacing Amplitude: 2.5 V
Lead Channel Setting Pacing Pulse Width: 0.5 ms
Lead Channel Setting Sensing Sensitivity: 2 mV
Pulse Gen Model: 2272
Pulse Gen Serial Number: 9123200

## 2021-03-28 NOTE — Progress Notes (Signed)
Remote pacemaker transmission.   

## 2021-06-19 DIAGNOSIS — Z961 Presence of intraocular lens: Secondary | ICD-10-CM | POA: Diagnosis not present

## 2021-06-19 DIAGNOSIS — H353221 Exudative age-related macular degeneration, left eye, with active choroidal neovascularization: Secondary | ICD-10-CM | POA: Diagnosis not present

## 2021-06-19 DIAGNOSIS — H35713 Central serous chorioretinopathy, bilateral: Secondary | ICD-10-CM | POA: Diagnosis not present

## 2021-07-11 DIAGNOSIS — G8929 Other chronic pain: Secondary | ICD-10-CM | POA: Diagnosis not present

## 2021-07-11 DIAGNOSIS — M47816 Spondylosis without myelopathy or radiculopathy, lumbar region: Secondary | ICD-10-CM | POA: Diagnosis not present

## 2021-07-11 DIAGNOSIS — M19011 Primary osteoarthritis, right shoulder: Secondary | ICD-10-CM | POA: Diagnosis not present

## 2021-07-11 DIAGNOSIS — G894 Chronic pain syndrome: Secondary | ICD-10-CM | POA: Diagnosis not present

## 2021-07-11 DIAGNOSIS — M5136 Other intervertebral disc degeneration, lumbar region: Secondary | ICD-10-CM | POA: Diagnosis not present

## 2021-07-11 DIAGNOSIS — M545 Low back pain, unspecified: Secondary | ICD-10-CM | POA: Diagnosis not present

## 2021-07-25 DIAGNOSIS — H35713 Central serous chorioretinopathy, bilateral: Secondary | ICD-10-CM | POA: Diagnosis not present

## 2021-07-25 DIAGNOSIS — H353221 Exudative age-related macular degeneration, left eye, with active choroidal neovascularization: Secondary | ICD-10-CM | POA: Diagnosis not present

## 2021-07-25 DIAGNOSIS — Z961 Presence of intraocular lens: Secondary | ICD-10-CM | POA: Diagnosis not present

## 2021-08-01 DIAGNOSIS — H353221 Exudative age-related macular degeneration, left eye, with active choroidal neovascularization: Secondary | ICD-10-CM | POA: Diagnosis not present

## 2021-08-01 DIAGNOSIS — H35713 Central serous chorioretinopathy, bilateral: Secondary | ICD-10-CM | POA: Diagnosis not present

## 2021-08-01 DIAGNOSIS — Z961 Presence of intraocular lens: Secondary | ICD-10-CM | POA: Diagnosis not present

## 2021-08-14 DIAGNOSIS — E039 Hypothyroidism, unspecified: Secondary | ICD-10-CM | POA: Diagnosis not present

## 2021-09-03 DIAGNOSIS — G8929 Other chronic pain: Secondary | ICD-10-CM | POA: Diagnosis not present

## 2021-09-03 DIAGNOSIS — M25511 Pain in right shoulder: Secondary | ICD-10-CM | POA: Diagnosis not present

## 2021-09-03 DIAGNOSIS — M19011 Primary osteoarthritis, right shoulder: Secondary | ICD-10-CM | POA: Diagnosis not present

## 2021-10-15 DIAGNOSIS — J449 Chronic obstructive pulmonary disease, unspecified: Secondary | ICD-10-CM | POA: Diagnosis not present

## 2021-10-15 DIAGNOSIS — R350 Frequency of micturition: Secondary | ICD-10-CM | POA: Diagnosis not present

## 2021-10-15 DIAGNOSIS — I10 Essential (primary) hypertension: Secondary | ICD-10-CM | POA: Diagnosis not present

## 2021-10-15 DIAGNOSIS — E039 Hypothyroidism, unspecified: Secondary | ICD-10-CM | POA: Diagnosis not present

## 2021-10-30 DIAGNOSIS — M19011 Primary osteoarthritis, right shoulder: Secondary | ICD-10-CM | POA: Diagnosis not present

## 2021-10-30 DIAGNOSIS — I10 Essential (primary) hypertension: Secondary | ICD-10-CM | POA: Diagnosis not present

## 2021-11-03 ENCOUNTER — Ambulatory Visit (INDEPENDENT_AMBULATORY_CARE_PROVIDER_SITE_OTHER): Payer: Medicare Other

## 2021-11-03 DIAGNOSIS — I495 Sick sinus syndrome: Secondary | ICD-10-CM

## 2021-11-04 LAB — CUP PACEART REMOTE DEVICE CHECK
Battery Remaining Longevity: 89 mo
Battery Remaining Percentage: 75 %
Battery Voltage: 3.02 V
Brady Statistic AP VP Percent: 1.2 %
Brady Statistic AP VS Percent: 92 %
Brady Statistic AS VP Percent: 1 %
Brady Statistic AS VS Percent: 7.1 %
Brady Statistic RA Percent Paced: 92 %
Brady Statistic RV Percent Paced: 1.2 %
Date Time Interrogation Session: 20230518184706
Implantable Lead Implant Date: 20001030
Implantable Lead Implant Date: 20110216
Implantable Lead Location: 753859
Implantable Lead Location: 753860
Implantable Pulse Generator Implant Date: 20200427
Lead Channel Impedance Value: 530 Ohm
Lead Channel Impedance Value: 560 Ohm
Lead Channel Pacing Threshold Amplitude: 0.625 V
Lead Channel Pacing Threshold Amplitude: 1.25 V
Lead Channel Pacing Threshold Pulse Width: 0.5 ms
Lead Channel Pacing Threshold Pulse Width: 0.5 ms
Lead Channel Sensing Intrinsic Amplitude: 1.5 mV
Lead Channel Sensing Intrinsic Amplitude: 7.2 mV
Lead Channel Setting Pacing Amplitude: 1.625
Lead Channel Setting Pacing Amplitude: 2.5 V
Lead Channel Setting Pacing Pulse Width: 0.5 ms
Lead Channel Setting Sensing Sensitivity: 2 mV
Pulse Gen Model: 2272
Pulse Gen Serial Number: 9123200

## 2021-11-18 DIAGNOSIS — E039 Hypothyroidism, unspecified: Secondary | ICD-10-CM | POA: Diagnosis not present

## 2021-11-20 NOTE — Progress Notes (Signed)
Remote pacemaker transmission.   

## 2021-11-28 DIAGNOSIS — G8929 Other chronic pain: Secondary | ICD-10-CM | POA: Diagnosis not present

## 2021-11-28 DIAGNOSIS — I10 Essential (primary) hypertension: Secondary | ICD-10-CM | POA: Diagnosis not present

## 2021-11-28 DIAGNOSIS — G894 Chronic pain syndrome: Secondary | ICD-10-CM | POA: Diagnosis not present

## 2021-11-28 DIAGNOSIS — M5136 Other intervertebral disc degeneration, lumbar region: Secondary | ICD-10-CM | POA: Diagnosis not present

## 2021-11-28 DIAGNOSIS — M47816 Spondylosis without myelopathy or radiculopathy, lumbar region: Secondary | ICD-10-CM | POA: Diagnosis not present

## 2021-11-28 DIAGNOSIS — M545 Low back pain, unspecified: Secondary | ICD-10-CM | POA: Diagnosis not present

## 2022-02-20 DIAGNOSIS — G8929 Other chronic pain: Secondary | ICD-10-CM | POA: Diagnosis not present

## 2022-02-20 DIAGNOSIS — M545 Low back pain, unspecified: Secondary | ICD-10-CM | POA: Diagnosis not present

## 2022-02-20 DIAGNOSIS — I10 Essential (primary) hypertension: Secondary | ICD-10-CM | POA: Diagnosis not present

## 2022-02-20 DIAGNOSIS — M5136 Other intervertebral disc degeneration, lumbar region: Secondary | ICD-10-CM | POA: Diagnosis not present

## 2022-02-20 DIAGNOSIS — M47816 Spondylosis without myelopathy or radiculopathy, lumbar region: Secondary | ICD-10-CM | POA: Diagnosis not present

## 2022-02-20 DIAGNOSIS — G894 Chronic pain syndrome: Secondary | ICD-10-CM | POA: Diagnosis not present

## 2022-02-27 DIAGNOSIS — E039 Hypothyroidism, unspecified: Secondary | ICD-10-CM | POA: Diagnosis not present

## 2022-02-27 DIAGNOSIS — Z23 Encounter for immunization: Secondary | ICD-10-CM | POA: Diagnosis not present

## 2022-02-27 DIAGNOSIS — N3281 Overactive bladder: Secondary | ICD-10-CM | POA: Diagnosis not present

## 2022-02-27 DIAGNOSIS — I10 Essential (primary) hypertension: Secondary | ICD-10-CM | POA: Diagnosis not present

## 2022-02-27 DIAGNOSIS — E785 Hyperlipidemia, unspecified: Secondary | ICD-10-CM | POA: Diagnosis not present

## 2022-02-27 DIAGNOSIS — R413 Other amnesia: Secondary | ICD-10-CM | POA: Diagnosis not present

## 2022-03-04 ENCOUNTER — Ambulatory Visit (INDEPENDENT_AMBULATORY_CARE_PROVIDER_SITE_OTHER): Payer: Medicare Other

## 2022-03-04 DIAGNOSIS — I495 Sick sinus syndrome: Secondary | ICD-10-CM

## 2022-03-05 LAB — CUP PACEART REMOTE DEVICE CHECK
Battery Remaining Longevity: 85 mo
Battery Remaining Percentage: 72 %
Battery Voltage: 3.01 V
Brady Statistic AP VP Percent: 1.3 %
Brady Statistic AP VS Percent: 92 %
Brady Statistic AS VP Percent: 1 %
Brady Statistic AS VS Percent: 7.1 %
Brady Statistic RA Percent Paced: 92 %
Brady Statistic RV Percent Paced: 1.3 %
Date Time Interrogation Session: 20230919184907
Implantable Lead Implant Date: 20001030
Implantable Lead Implant Date: 20110216
Implantable Lead Location: 753859
Implantable Lead Location: 753860
Implantable Pulse Generator Implant Date: 20200427
Lead Channel Impedance Value: 510 Ohm
Lead Channel Impedance Value: 600 Ohm
Lead Channel Pacing Threshold Amplitude: 0.75 V
Lead Channel Pacing Threshold Amplitude: 1.25 V
Lead Channel Pacing Threshold Pulse Width: 0.5 ms
Lead Channel Pacing Threshold Pulse Width: 0.5 ms
Lead Channel Sensing Intrinsic Amplitude: 2.7 mV
Lead Channel Sensing Intrinsic Amplitude: 7.9 mV
Lead Channel Setting Pacing Amplitude: 1.75 V
Lead Channel Setting Pacing Amplitude: 2.5 V
Lead Channel Setting Pacing Pulse Width: 0.5 ms
Lead Channel Setting Sensing Sensitivity: 2 mV
Pulse Gen Model: 2272
Pulse Gen Serial Number: 9123200

## 2022-03-17 NOTE — Progress Notes (Signed)
Remote pacemaker transmission.   

## 2022-03-30 DIAGNOSIS — I10 Essential (primary) hypertension: Secondary | ICD-10-CM | POA: Diagnosis not present

## 2022-03-30 DIAGNOSIS — H6993 Unspecified Eustachian tube disorder, bilateral: Secondary | ICD-10-CM | POA: Diagnosis not present

## 2022-03-30 DIAGNOSIS — E039 Hypothyroidism, unspecified: Secondary | ICD-10-CM | POA: Diagnosis not present

## 2022-03-30 DIAGNOSIS — R42 Dizziness and giddiness: Secondary | ICD-10-CM | POA: Diagnosis not present

## 2022-04-01 DIAGNOSIS — I10 Essential (primary) hypertension: Secondary | ICD-10-CM | POA: Diagnosis not present

## 2022-04-01 DIAGNOSIS — M19011 Primary osteoarthritis, right shoulder: Secondary | ICD-10-CM | POA: Diagnosis not present

## 2022-04-13 DIAGNOSIS — R300 Dysuria: Secondary | ICD-10-CM | POA: Diagnosis not present

## 2022-04-14 DIAGNOSIS — R300 Dysuria: Secondary | ICD-10-CM | POA: Diagnosis not present

## 2022-05-01 DIAGNOSIS — G8929 Other chronic pain: Secondary | ICD-10-CM | POA: Diagnosis not present

## 2022-05-01 DIAGNOSIS — R42 Dizziness and giddiness: Secondary | ICD-10-CM | POA: Diagnosis not present

## 2022-05-01 DIAGNOSIS — M545 Low back pain, unspecified: Secondary | ICD-10-CM | POA: Diagnosis not present

## 2022-05-01 DIAGNOSIS — G894 Chronic pain syndrome: Secondary | ICD-10-CM | POA: Diagnosis not present

## 2022-05-01 DIAGNOSIS — M5136 Other intervertebral disc degeneration, lumbar region: Secondary | ICD-10-CM | POA: Diagnosis not present

## 2022-05-01 DIAGNOSIS — M47816 Spondylosis without myelopathy or radiculopathy, lumbar region: Secondary | ICD-10-CM | POA: Diagnosis not present

## 2022-05-01 DIAGNOSIS — I10 Essential (primary) hypertension: Secondary | ICD-10-CM | POA: Diagnosis not present

## 2022-06-03 ENCOUNTER — Ambulatory Visit (INDEPENDENT_AMBULATORY_CARE_PROVIDER_SITE_OTHER): Payer: Self-pay

## 2022-06-03 DIAGNOSIS — I495 Sick sinus syndrome: Secondary | ICD-10-CM

## 2022-06-05 DIAGNOSIS — I10 Essential (primary) hypertension: Secondary | ICD-10-CM | POA: Diagnosis not present

## 2022-06-05 DIAGNOSIS — R413 Other amnesia: Secondary | ICD-10-CM | POA: Diagnosis not present

## 2022-06-05 DIAGNOSIS — E039 Hypothyroidism, unspecified: Secondary | ICD-10-CM | POA: Diagnosis not present

## 2022-06-05 DIAGNOSIS — R109 Unspecified abdominal pain: Secondary | ICD-10-CM | POA: Diagnosis not present

## 2022-06-05 DIAGNOSIS — Z0000 Encounter for general adult medical examination without abnormal findings: Secondary | ICD-10-CM | POA: Diagnosis not present

## 2022-06-09 LAB — CUP PACEART REMOTE DEVICE CHECK
Battery Remaining Longevity: 82 mo
Battery Remaining Percentage: 70 %
Battery Voltage: 3.01 V
Brady Statistic AP VP Percent: 1.3 %
Brady Statistic AP VS Percent: 91 %
Brady Statistic AS VP Percent: 1 %
Brady Statistic AS VS Percent: 7.5 %
Brady Statistic RA Percent Paced: 92 %
Brady Statistic RV Percent Paced: 1.4 %
Date Time Interrogation Session: 20231225122704
Implantable Lead Connection Status: 753985
Implantable Lead Connection Status: 753985
Implantable Lead Implant Date: 20001030
Implantable Lead Implant Date: 20110216
Implantable Lead Location: 753859
Implantable Lead Location: 753860
Implantable Pulse Generator Implant Date: 20200427
Lead Channel Impedance Value: 530 Ohm
Lead Channel Impedance Value: 560 Ohm
Lead Channel Pacing Threshold Amplitude: 0.75 V
Lead Channel Pacing Threshold Amplitude: 1.25 V
Lead Channel Pacing Threshold Pulse Width: 0.5 ms
Lead Channel Pacing Threshold Pulse Width: 0.5 ms
Lead Channel Sensing Intrinsic Amplitude: 2.1 mV
Lead Channel Sensing Intrinsic Amplitude: 7.8 mV
Lead Channel Setting Pacing Amplitude: 1.75 V
Lead Channel Setting Pacing Amplitude: 2.5 V
Lead Channel Setting Pacing Pulse Width: 0.5 ms
Lead Channel Setting Sensing Sensitivity: 2 mV
Pulse Gen Model: 2272
Pulse Gen Serial Number: 9123200

## 2022-06-18 DIAGNOSIS — R350 Frequency of micturition: Secondary | ICD-10-CM | POA: Diagnosis not present

## 2022-06-19 DIAGNOSIS — R42 Dizziness and giddiness: Secondary | ICD-10-CM | POA: Diagnosis not present

## 2022-06-19 DIAGNOSIS — H9313 Tinnitus, bilateral: Secondary | ICD-10-CM | POA: Diagnosis not present

## 2022-06-25 ENCOUNTER — Telehealth: Payer: Self-pay | Admitting: Internal Medicine

## 2022-06-25 NOTE — Telephone Encounter (Signed)
Patient would like to be referred to an EP doctor in [facility] ([place]). She states she can't drive all the way to [place] she lives in [place].

## 2022-06-26 ENCOUNTER — Telehealth: Payer: Self-pay

## 2022-06-26 NOTE — Telephone Encounter (Signed)
See RN note.... forwarding request to Dr. Lovena Le for advisement.

## 2022-06-26 NOTE — Telephone Encounter (Signed)
Patient is calling back for and update. Please advise

## 2022-06-26 NOTE — Telephone Encounter (Signed)
Called Pt per message received in [place] Triage.    Pt wants to be referred to an EP provider in [place], [facility], and stated she has difficulty driving to [place].  Pt loves Dr. Tanna Furry care, but needs to be referred to a provider close to home.    Pt's PCP was supposed to fax paperwork to check on Pt's device compatibility with an MRI?  [employer] checked, no paperwork found.  Follow up regarding question is still required.

## 2022-06-26 NOTE — Telephone Encounter (Signed)
[facility] [address] wanting:  Patient would like to be referred to an EP doctor in [place] ([place]). She states she can't drive all the way to [place] she lives in [place].    Pt has a St Jude PPM, and has not see Dr. Lovena Le since 01/10/2019.    I will forward her request to Dr. Lovena Le; Unsure wether EP provider is available there or not?  Follow up still required.

## 2022-06-29 NOTE — Progress Notes (Signed)
Remote pacemaker transmission.   

## 2022-08-20 DIAGNOSIS — J449 Chronic obstructive pulmonary disease, unspecified: Secondary | ICD-10-CM | POA: Diagnosis not present

## 2022-09-02 ENCOUNTER — Ambulatory Visit (INDEPENDENT_AMBULATORY_CARE_PROVIDER_SITE_OTHER): Payer: Medicare Other

## 2022-09-02 DIAGNOSIS — I495 Sick sinus syndrome: Secondary | ICD-10-CM

## 2022-09-03 LAB — CUP PACEART REMOTE DEVICE CHECK
Battery Remaining Longevity: 80 mo
Battery Remaining Percentage: 68 %
Battery Voltage: 3.01 V
Brady Statistic AP VP Percent: 1.3 %
Brady Statistic AP VS Percent: 91 %
Brady Statistic AS VP Percent: 1 %
Brady Statistic AS VS Percent: 8.1 %
Brady Statistic RA Percent Paced: 91 %
Brady Statistic RV Percent Paced: 1.3 %
Date Time Interrogation Session: 20240320020243
Implantable Lead Connection Status: 753985
Implantable Lead Connection Status: 753985
Implantable Lead Implant Date: 20001030
Implantable Lead Implant Date: 20110216
Implantable Lead Location: 753859
Implantable Lead Location: 753860
Implantable Pulse Generator Implant Date: 20200427
Lead Channel Impedance Value: 560 Ohm
Lead Channel Impedance Value: 560 Ohm
Lead Channel Pacing Threshold Amplitude: 0.75 V
Lead Channel Pacing Threshold Amplitude: 1.25 V
Lead Channel Pacing Threshold Pulse Width: 0.5 ms
Lead Channel Pacing Threshold Pulse Width: 0.5 ms
Lead Channel Sensing Intrinsic Amplitude: 1.7 mV
Lead Channel Sensing Intrinsic Amplitude: 6.7 mV
Lead Channel Setting Pacing Amplitude: 1.75 V
Lead Channel Setting Pacing Amplitude: 2.5 V
Lead Channel Setting Pacing Pulse Width: 0.5 ms
Lead Channel Setting Sensing Sensitivity: 2 mV
Pulse Gen Model: 2272
Pulse Gen Serial Number: 9123200

## 2022-10-12 NOTE — Progress Notes (Signed)
Remote pacemaker transmission.   

## 2022-12-02 ENCOUNTER — Ambulatory Visit (INDEPENDENT_AMBULATORY_CARE_PROVIDER_SITE_OTHER): Payer: Medicare Other

## 2022-12-02 DIAGNOSIS — I495 Sick sinus syndrome: Secondary | ICD-10-CM

## 2022-12-03 LAB — CUP PACEART REMOTE DEVICE CHECK
Battery Remaining Longevity: 76 mo
Battery Remaining Percentage: 65 %
Battery Voltage: 3.01 V
Brady Statistic AP VP Percent: 1.3 %
Brady Statistic AP VS Percent: 90 %
Brady Statistic AS VP Percent: 1 %
Brady Statistic AS VS Percent: 8.5 %
Brady Statistic RA Percent Paced: 91 %
Brady Statistic RV Percent Paced: 1.3 %
Date Time Interrogation Session: 20240619020013
Implantable Lead Connection Status: 753985
Implantable Lead Connection Status: 753985
Implantable Lead Implant Date: 20001030
Implantable Lead Implant Date: 20110216
Implantable Lead Location: 753859
Implantable Lead Location: 753860
Implantable Pulse Generator Implant Date: 20200427
Lead Channel Impedance Value: 450 Ohm
Lead Channel Impedance Value: 550 Ohm
Lead Channel Pacing Threshold Amplitude: 0.75 V
Lead Channel Pacing Threshold Amplitude: 1.25 V
Lead Channel Pacing Threshold Pulse Width: 0.5 ms
Lead Channel Pacing Threshold Pulse Width: 0.5 ms
Lead Channel Sensing Intrinsic Amplitude: 2.9 mV
Lead Channel Sensing Intrinsic Amplitude: 9.5 mV
Lead Channel Setting Pacing Amplitude: 1.75 V
Lead Channel Setting Pacing Amplitude: 2.5 V
Lead Channel Setting Pacing Pulse Width: 0.5 ms
Lead Channel Setting Sensing Sensitivity: 2 mV
Pulse Gen Model: 2272
Pulse Gen Serial Number: 9123200

## 2023-03-03 ENCOUNTER — Ambulatory Visit (INDEPENDENT_AMBULATORY_CARE_PROVIDER_SITE_OTHER): Payer: Medicare Other

## 2023-03-03 DIAGNOSIS — I495 Sick sinus syndrome: Secondary | ICD-10-CM | POA: Diagnosis not present

## 2023-03-03 LAB — CUP PACEART REMOTE DEVICE CHECK
Battery Remaining Longevity: 73 mo
Battery Remaining Percentage: 63 %
Battery Voltage: 3.01 V
Brady Statistic AP VP Percent: 1.3 %
Brady Statistic AP VS Percent: 90 %
Brady Statistic AS VP Percent: 1 %
Brady Statistic AS VS Percent: 8.7 %
Brady Statistic RA Percent Paced: 90 %
Brady Statistic RV Percent Paced: 1.3 %
Date Time Interrogation Session: 20240918031804
Implantable Lead Connection Status: 753985
Implantable Lead Connection Status: 753985
Implantable Lead Implant Date: 20001030
Implantable Lead Implant Date: 20110216
Implantable Lead Location: 753859
Implantable Lead Location: 753860
Implantable Pulse Generator Implant Date: 20200427
Lead Channel Impedance Value: 450 Ohm
Lead Channel Impedance Value: 530 Ohm
Lead Channel Pacing Threshold Amplitude: 0.75 V
Lead Channel Pacing Threshold Amplitude: 1.25 V
Lead Channel Pacing Threshold Pulse Width: 0.5 ms
Lead Channel Pacing Threshold Pulse Width: 0.5 ms
Lead Channel Sensing Intrinsic Amplitude: 2.3 mV
Lead Channel Sensing Intrinsic Amplitude: 7.2 mV
Lead Channel Setting Pacing Amplitude: 1.75 V
Lead Channel Setting Pacing Amplitude: 2.5 V
Lead Channel Setting Pacing Pulse Width: 0.5 ms
Lead Channel Setting Sensing Sensitivity: 2 mV
Pulse Gen Model: 2272
Pulse Gen Serial Number: 9123200

## 2023-03-09 NOTE — Progress Notes (Signed)
Remote pacemaker transmission.   

## 2023-03-18 NOTE — Progress Notes (Signed)
Remote pacemaker transmission.   

## 2023-06-02 ENCOUNTER — Ambulatory Visit (INDEPENDENT_AMBULATORY_CARE_PROVIDER_SITE_OTHER): Payer: Medicare Other

## 2023-06-02 DIAGNOSIS — I495 Sick sinus syndrome: Secondary | ICD-10-CM

## 2023-06-02 LAB — CUP PACEART REMOTE DEVICE CHECK
Battery Remaining Longevity: 69 mo
Battery Remaining Percentage: 60 %
Battery Voltage: 2.99 V
Brady Statistic AP VP Percent: 1.5 %
Brady Statistic AP VS Percent: 90 %
Brady Statistic AS VP Percent: 1 %
Brady Statistic AS VS Percent: 8.5 %
Brady Statistic RA Percent Paced: 91 %
Brady Statistic RV Percent Paced: 1.5 %
Date Time Interrogation Session: 20241218031659
Implantable Lead Connection Status: 753985
Implantable Lead Connection Status: 753985
Implantable Lead Implant Date: 20001030
Implantable Lead Implant Date: 20110216
Implantable Lead Location: 753859
Implantable Lead Location: 753860
Implantable Pulse Generator Implant Date: 20200427
Lead Channel Impedance Value: 480 Ohm
Lead Channel Impedance Value: 540 Ohm
Lead Channel Pacing Threshold Amplitude: 0.75 V
Lead Channel Pacing Threshold Amplitude: 1.25 V
Lead Channel Pacing Threshold Pulse Width: 0.5 ms
Lead Channel Pacing Threshold Pulse Width: 0.5 ms
Lead Channel Sensing Intrinsic Amplitude: 1.1 mV
Lead Channel Sensing Intrinsic Amplitude: 6.5 mV
Lead Channel Setting Pacing Amplitude: 1.75 V
Lead Channel Setting Pacing Amplitude: 2.5 V
Lead Channel Setting Pacing Pulse Width: 0.5 ms
Lead Channel Setting Sensing Sensitivity: 2 mV
Pulse Gen Model: 2272
Pulse Gen Serial Number: 9123200

## 2023-07-12 NOTE — Progress Notes (Signed)
Remote pacemaker transmission.

## 2023-07-12 NOTE — Addendum Note (Signed)
Addended by: Geralyn Flash D on: 07/12/2023 11:50 AM   Modules accepted: Orders

## 2023-09-01 ENCOUNTER — Ambulatory Visit (INDEPENDENT_AMBULATORY_CARE_PROVIDER_SITE_OTHER): Payer: Self-pay

## 2023-09-01 DIAGNOSIS — I495 Sick sinus syndrome: Secondary | ICD-10-CM

## 2023-09-02 LAB — CUP PACEART REMOTE DEVICE CHECK
Battery Remaining Longevity: 65 mo
Battery Remaining Percentage: 58 %
Battery Voltage: 2.99 V
Brady Statistic AP VP Percent: 1.9 %
Brady Statistic AP VS Percent: 90 %
Brady Statistic AS VP Percent: 1 %
Brady Statistic AS VS Percent: 8.3 %
Brady Statistic RA Percent Paced: 91 %
Brady Statistic RV Percent Paced: 2 %
Date Time Interrogation Session: 20250319025619
Implantable Lead Connection Status: 753985
Implantable Lead Connection Status: 753985
Implantable Lead Implant Date: 20001030
Implantable Lead Implant Date: 20110216
Implantable Lead Location: 753859
Implantable Lead Location: 753860
Implantable Pulse Generator Implant Date: 20200427
Lead Channel Impedance Value: 450 Ohm
Lead Channel Impedance Value: 510 Ohm
Lead Channel Pacing Threshold Amplitude: 0.75 V
Lead Channel Pacing Threshold Amplitude: 1.25 V
Lead Channel Pacing Threshold Pulse Width: 0.5 ms
Lead Channel Pacing Threshold Pulse Width: 0.5 ms
Lead Channel Sensing Intrinsic Amplitude: 2.5 mV
Lead Channel Sensing Intrinsic Amplitude: 7.6 mV
Lead Channel Setting Pacing Amplitude: 1.75 V
Lead Channel Setting Pacing Amplitude: 2.5 V
Lead Channel Setting Pacing Pulse Width: 0.5 ms
Lead Channel Setting Sensing Sensitivity: 2 mV
Pulse Gen Model: 2272
Pulse Gen Serial Number: 9123200

## 2023-09-06 ENCOUNTER — Encounter: Payer: Self-pay | Admitting: Internal Medicine

## 2023-10-15 NOTE — Progress Notes (Signed)
 Remote pacemaker transmission.

## 2023-10-15 NOTE — Addendum Note (Signed)
 Addended by: Lott Rouleau A on: 10/15/2023 11:53 AM   Modules accepted: Orders

## 2023-12-01 ENCOUNTER — Ambulatory Visit (INDEPENDENT_AMBULATORY_CARE_PROVIDER_SITE_OTHER): Payer: Self-pay

## 2023-12-01 DIAGNOSIS — I495 Sick sinus syndrome: Secondary | ICD-10-CM | POA: Diagnosis not present

## 2023-12-02 ENCOUNTER — Ambulatory Visit: Payer: Self-pay | Admitting: Internal Medicine

## 2023-12-02 LAB — CUP PACEART REMOTE DEVICE CHECK
Battery Remaining Longevity: 64 mo
Battery Remaining Percentage: 55 %
Battery Voltage: 2.99 V
Brady Statistic AP VP Percent: 2.1 %
Brady Statistic AP VS Percent: 90 %
Brady Statistic AS VP Percent: 1 %
Brady Statistic AS VS Percent: 8.2 %
Brady Statistic RA Percent Paced: 91 %
Brady Statistic RV Percent Paced: 2.1 %
Date Time Interrogation Session: 20250618020022
Implantable Lead Connection Status: 753985
Implantable Lead Connection Status: 753985
Implantable Lead Implant Date: 20001030
Implantable Lead Implant Date: 20110216
Implantable Lead Location: 753859
Implantable Lead Location: 753860
Implantable Pulse Generator Implant Date: 20200427
Lead Channel Impedance Value: 460 Ohm
Lead Channel Impedance Value: 510 Ohm
Lead Channel Pacing Threshold Amplitude: 0.625 V
Lead Channel Pacing Threshold Amplitude: 1.25 V
Lead Channel Pacing Threshold Pulse Width: 0.5 ms
Lead Channel Pacing Threshold Pulse Width: 0.5 ms
Lead Channel Sensing Intrinsic Amplitude: 1.9 mV
Lead Channel Sensing Intrinsic Amplitude: 6.4 mV
Lead Channel Setting Pacing Amplitude: 1.625
Lead Channel Setting Pacing Amplitude: 2.5 V
Lead Channel Setting Pacing Pulse Width: 0.5 ms
Lead Channel Setting Sensing Sensitivity: 2 mV
Pulse Gen Model: 2272
Pulse Gen Serial Number: 9123200

## 2024-02-09 NOTE — Addendum Note (Signed)
 Addended by: Magdelena Kinsella A on: 02/09/2024 03:19 PM   Modules accepted: Orders

## 2024-02-09 NOTE — Progress Notes (Signed)
 Remote pacemaker transmission.

## 2024-03-01 ENCOUNTER — Ambulatory Visit (INDEPENDENT_AMBULATORY_CARE_PROVIDER_SITE_OTHER): Payer: Self-pay

## 2024-03-01 DIAGNOSIS — I495 Sick sinus syndrome: Secondary | ICD-10-CM | POA: Diagnosis not present

## 2024-03-02 LAB — CUP PACEART REMOTE DEVICE CHECK
Battery Remaining Longevity: 58 mo
Battery Remaining Percentage: 52 %
Battery Voltage: 2.99 V
Brady Statistic AP VP Percent: 2.1 %
Brady Statistic AP VS Percent: 90 %
Brady Statistic AS VP Percent: 1 %
Brady Statistic AS VS Percent: 8.1 %
Brady Statistic RA Percent Paced: 91 %
Brady Statistic RV Percent Paced: 2.1 %
Date Time Interrogation Session: 20250917021137
Implantable Lead Connection Status: 753985
Implantable Lead Connection Status: 753985
Implantable Lead Implant Date: 20001030
Implantable Lead Implant Date: 20110216
Implantable Lead Location: 753859
Implantable Lead Location: 753860
Implantable Pulse Generator Implant Date: 20200427
Lead Channel Impedance Value: 400 Ohm
Lead Channel Impedance Value: 450 Ohm
Lead Channel Pacing Threshold Amplitude: 0.75 V
Lead Channel Pacing Threshold Amplitude: 1.25 V
Lead Channel Pacing Threshold Pulse Width: 0.5 ms
Lead Channel Pacing Threshold Pulse Width: 0.5 ms
Lead Channel Sensing Intrinsic Amplitude: 2.1 mV
Lead Channel Sensing Intrinsic Amplitude: 5.7 mV
Lead Channel Setting Pacing Amplitude: 1.75 V
Lead Channel Setting Pacing Amplitude: 2.5 V
Lead Channel Setting Pacing Pulse Width: 0.5 ms
Lead Channel Setting Sensing Sensitivity: 2 mV
Pulse Gen Model: 2272
Pulse Gen Serial Number: 9123200

## 2024-03-06 NOTE — Progress Notes (Signed)
 Remote PPM Transmission

## 2024-03-11 ENCOUNTER — Ambulatory Visit: Payer: Self-pay | Admitting: Internal Medicine
